# Patient Record
Sex: Male | Born: 1935 | Race: White | Hispanic: No | Marital: Married
Health system: Southern US, Community
[De-identification: ages and names within clinical notes are randomized; demographics above are authoritative.]

---

## 2004-06-08 ENCOUNTER — Ambulatory Visit: Payer: Self-pay

## 2006-09-13 ENCOUNTER — Emergency Department: Payer: Self-pay | Admitting: Emergency Medicine

## 2007-03-23 ENCOUNTER — Other Ambulatory Visit: Payer: Self-pay

## 2007-03-23 ENCOUNTER — Ambulatory Visit: Payer: Self-pay | Admitting: Unknown Physician Specialty

## 2007-03-28 ENCOUNTER — Ambulatory Visit: Payer: Self-pay | Admitting: Unknown Physician Specialty

## 2007-05-03 ENCOUNTER — Inpatient Hospital Stay: Payer: Self-pay | Admitting: Internal Medicine

## 2007-05-03 ENCOUNTER — Other Ambulatory Visit: Payer: Self-pay

## 2007-05-04 ENCOUNTER — Other Ambulatory Visit: Payer: Self-pay

## 2009-05-29 ENCOUNTER — Ambulatory Visit: Payer: Self-pay | Admitting: Gastroenterology

## 2011-03-19 ENCOUNTER — Other Ambulatory Visit: Payer: Self-pay | Admitting: Rheumatology

## 2011-04-12 ENCOUNTER — Ambulatory Visit: Payer: Self-pay

## 2011-06-28 ENCOUNTER — Inpatient Hospital Stay: Payer: Self-pay | Admitting: Internal Medicine

## 2011-06-28 LAB — CBC
HCT: 24 % — ABNORMAL LOW (ref 40.0–52.0)
HGB: 7.4 g/dL — ABNORMAL LOW (ref 13.0–18.0)
MCHC: 30.9 g/dL — ABNORMAL LOW (ref 32.0–36.0)
MCV: 85 fL (ref 80–100)
Platelet: 322 10*3/uL (ref 150–440)
RBC: 2.8 10*6/uL — ABNORMAL LOW (ref 4.40–5.90)
RDW: 18.1 % — ABNORMAL HIGH (ref 11.5–14.5)
WBC: 6.5 10*3/uL (ref 3.8–10.6)

## 2011-06-28 LAB — COMPREHENSIVE METABOLIC PANEL
Alkaline Phosphatase: 98 U/L (ref 50–136)
BUN: 21 mg/dL — ABNORMAL HIGH (ref 7–18)
Bilirubin,Total: 0.5 mg/dL (ref 0.2–1.0)
Chloride: 104 mmol/L (ref 98–107)
Co2: 33 mmol/L — ABNORMAL HIGH (ref 21–32)
EGFR (Non-African Amer.): >60
Potassium: 4.1 mmol/L (ref 3.5–5.1)
SGOT(AST): 20 U/L (ref 15–37)
SGPT (ALT): 25 U/L

## 2011-06-28 LAB — URINALYSIS, COMPLETE
Blood: NEGATIVE
Glucose,UR: NEGATIVE mg/dL (ref 0–75)
Ketone: NEGATIVE
Ph: 7 (ref 4.5–8.0)
Protein: NEGATIVE
Specific Gravity: 1.006 (ref 1.003–1.030)

## 2011-06-28 LAB — HEMOGLOBIN: HGB: 7.5 g/dL — ABNORMAL LOW (ref 13.0–18.0)

## 2011-06-28 LAB — PROTIME-INR: Prothrombin Time: 13.3 secs (ref 11.5–14.7)

## 2011-06-29 LAB — BASIC METABOLIC PANEL
Anion Gap: 9 (ref 7–16)
Calcium, Total: 8.2 mg/dL — ABNORMAL LOW (ref 8.5–10.1)
Chloride: 104 mmol/L (ref 98–107)
Co2: 32 mmol/L (ref 21–32)
Creatinine: 0.79 mg/dL (ref 0.60–1.30)
EGFR (African American): >60
Sodium: 145 mmol/L (ref 136–145)

## 2011-06-29 LAB — CBC WITH DIFFERENTIAL/PLATELET
Eosinophil #: 0.6 10*3/uL (ref 0.0–0.7)
Eosinophil %: 8.8 %
HCT: 27.6 % — ABNORMAL LOW (ref 40.0–52.0)
HGB: 8.7 g/dL — ABNORMAL LOW (ref 13.0–18.0)
Lymphocyte #: 0.7 10*3/uL — ABNORMAL LOW (ref 1.0–3.6)
MCH: 27.3 pg (ref 26.0–34.0)
MCV: 86 fL (ref 80–100)
Monocyte %: 7.4 %
Neutrophil %: 74.2 %
Platelet: 285 10*3/uL (ref 150–440)
RDW: 17.3 % — ABNORMAL HIGH (ref 11.5–14.5)

## 2011-06-29 LAB — HEMOGLOBIN: HGB: 8.8 g/dL — ABNORMAL LOW (ref 13.0–18.0)

## 2011-07-04 ENCOUNTER — Inpatient Hospital Stay: Payer: Self-pay | Admitting: Internal Medicine

## 2011-07-04 LAB — CBC
MCHC: 30.7 g/dL — ABNORMAL LOW (ref 32.0–36.0)
MCV: 87 fL (ref 80–100)
Platelet: 311 10*3/uL (ref 150–440)
RBC: 3.32 10*6/uL — ABNORMAL LOW (ref 4.40–5.90)
RDW: 18.7 % — ABNORMAL HIGH (ref 11.5–14.5)
WBC: 11.9 10*3/uL — ABNORMAL HIGH (ref 3.8–10.6)

## 2011-07-04 LAB — URINALYSIS, COMPLETE
Bacteria: NONE SEEN
Bilirubin,UR: NEGATIVE
Blood: NEGATIVE
Glucose,UR: NEGATIVE mg/dL (ref 0–75)
Leukocyte Esterase: NEGATIVE
Nitrite: NEGATIVE
Ph: 5 (ref 4.5–8.0)
Squamous Epithelial: NONE SEEN
WBC UR: NONE SEEN /HPF (ref 0–5)

## 2011-07-04 LAB — COMPREHENSIVE METABOLIC PANEL
Alkaline Phosphatase: 112 U/L (ref 50–136)
Anion Gap: 7 (ref 7–16)
Bilirubin,Total: 0.6 mg/dL (ref 0.2–1.0)
Calcium, Total: 8.4 mg/dL — ABNORMAL LOW (ref 8.5–10.1)
Chloride: 103 mmol/L (ref 98–107)
Co2: 34 mmol/L — ABNORMAL HIGH (ref 21–32)
Creatinine: 0.76 mg/dL (ref 0.60–1.30)
EGFR (Non-African Amer.): >60
Osmolality: 290 (ref 275–301)
Potassium: 4.2 mmol/L (ref 3.5–5.1)
Sodium: 144 mmol/L (ref 136–145)
Total Protein: 6.7 g/dL (ref 6.4–8.2)

## 2011-07-04 LAB — PROTIME-INR
INR: 1.1
Prothrombin Time: 14.3 secs (ref 11.5–14.7)

## 2011-07-04 LAB — CK TOTAL AND CKMB (NOT AT ARMC)
CK, Total: 30 U/L — ABNORMAL LOW (ref 35–232)
CK-MB: 1.1 ng/mL (ref 0.5–3.6)

## 2011-07-05 LAB — BASIC METABOLIC PANEL
Anion Gap: 8 (ref 7–16)
BUN: 20 mg/dL — ABNORMAL HIGH (ref 7–18)
Calcium, Total: 8.6 mg/dL (ref 8.5–10.1)
Chloride: 99 mmol/L (ref 98–107)
EGFR (African American): >60
Osmolality: 290 (ref 275–301)
Potassium: 4.2 mmol/L (ref 3.5–5.1)

## 2011-07-05 LAB — CBC WITH DIFFERENTIAL/PLATELET
Basophil #: 0 10*3/uL (ref 0.0–0.1)
Basophil %: 0 %
HCT: 27.7 % — ABNORMAL LOW (ref 40.0–52.0)
MCH: 26.8 pg (ref 26.0–34.0)
MCV: 87 fL (ref 80–100)
Monocyte #: 0.3 10*3/uL (ref 0.0–0.7)
Neutrophil #: 8.8 10*3/uL — ABNORMAL HIGH (ref 1.4–6.5)
Platelet: 306 10*3/uL (ref 150–440)
RBC: 3.19 10*6/uL — ABNORMAL LOW (ref 4.40–5.90)
WBC: 9.3 10*3/uL (ref 3.8–10.6)

## 2011-07-06 LAB — VANCOMYCIN, TROUGH: Vancomycin, Trough: 14 ug/mL (ref 10–20)

## 2011-07-08 LAB — CBC WITH DIFFERENTIAL/PLATELET
Bands: 7 %
HCT: 29.8 % — ABNORMAL LOW (ref 40.0–52.0)
HGB: 9.2 g/dL — ABNORMAL LOW (ref 13.0–18.0)
MCH: 26.4 pg (ref 26.0–34.0)
MCHC: 30.9 g/dL — ABNORMAL LOW (ref 32.0–36.0)
MCV: 85 fL (ref 80–100)
Monocytes: 4 %
RDW: 18.4 % — ABNORMAL HIGH (ref 11.5–14.5)

## 2011-07-08 LAB — BASIC METABOLIC PANEL
Calcium, Total: 8.3 mg/dL — ABNORMAL LOW (ref 8.5–10.1)
EGFR (African American): >60
EGFR (Non-African Amer.): >60
Glucose: 164 mg/dL — ABNORMAL HIGH (ref 65–99)
Osmolality: 287 (ref 275–301)
Sodium: 138 mmol/L (ref 136–145)

## 2011-07-10 LAB — EXPECTORATED SPUTUM ASSESSMENT W GRAM STAIN, RFLX TO RESP C

## 2011-07-10 LAB — CULTURE, BLOOD (SINGLE)

## 2011-10-22 ENCOUNTER — Inpatient Hospital Stay: Payer: Self-pay | Admitting: Student

## 2011-10-22 LAB — CBC
HCT: 30.1 % — ABNORMAL LOW (ref 40.0–52.0)
HGB: 9.4 g/dL — ABNORMAL LOW (ref 13.0–18.0)
MCH: 27.6 pg (ref 26.0–34.0)
MCV: 89 fL (ref 80–100)
Platelet: 286 10*3/uL (ref 150–440)
RBC: 3.4 10*6/uL — ABNORMAL LOW (ref 4.40–5.90)
RDW: 15.8 % — ABNORMAL HIGH (ref 11.5–14.5)
WBC: 8.3 10*3/uL (ref 3.8–10.6)

## 2011-10-22 LAB — URINALYSIS, COMPLETE
Bacteria: NONE SEEN
Bilirubin,UR: NEGATIVE
Ketone: NEGATIVE
Nitrite: NEGATIVE
Protein: NEGATIVE
Specific Gravity: 1.018 (ref 1.003–1.030)
Squamous Epithelial: <1
WBC UR: 3 /HPF (ref 0–5)

## 2011-10-22 LAB — PROTIME-INR
INR: 1
Prothrombin Time: 13.6 secs (ref 11.5–14.7)

## 2011-10-22 LAB — COMPREHENSIVE METABOLIC PANEL
Albumin: 2.6 g/dL — ABNORMAL LOW (ref 3.4–5.0)
Anion Gap: 6 — ABNORMAL LOW (ref 7–16)
BUN: 22 mg/dL — ABNORMAL HIGH (ref 7–18)
Bilirubin,Total: 0.5 mg/dL (ref 0.2–1.0)
Calcium, Total: 9 mg/dL (ref 8.5–10.1)
Chloride: 107 mmol/L (ref 98–107)
Co2: 30 mmol/L (ref 21–32)
Creatinine: 0.89 mg/dL (ref 0.60–1.30)
EGFR (African American): >60
EGFR (Non-African Amer.): >60
Glucose: 121 mg/dL — ABNORMAL HIGH (ref 65–99)
Osmolality: 290 (ref 275–301)
SGPT (ALT): 27 U/L
Sodium: 143 mmol/L (ref 136–145)
Total Protein: 6.4 g/dL (ref 6.4–8.2)

## 2011-10-22 LAB — TROPONIN I: Troponin-I: <0.02 ng/mL

## 2011-10-22 LAB — HEMOGLOBIN: HGB: 8.9 g/dL — ABNORMAL LOW (ref 13.0–18.0)

## 2011-10-22 LAB — APTT: Activated PTT: 30.9 secs (ref 23.6–35.9)

## 2011-10-23 LAB — CBC WITH DIFFERENTIAL/PLATELET
Basophil #: 0.1 10*3/uL (ref 0.0–0.1)
Basophil %: 0.7 %
Eosinophil #: 1.1 10*3/uL — ABNORMAL HIGH (ref 0.0–0.7)
Eosinophil %: 13 %
HGB: 8.4 g/dL — ABNORMAL LOW (ref 13.0–18.0)
Lymphocyte %: 13.4 %
MCH: 27.7 pg (ref 26.0–34.0)
MCHC: 31.1 g/dL — ABNORMAL LOW (ref 32.0–36.0)
MCV: 89 fL (ref 80–100)
Monocyte %: 9.8 %
Neutrophil #: 5.1 10*3/uL (ref 1.4–6.5)
Neutrophil %: 63.1 %
Platelet: 237 10*3/uL (ref 150–440)
RDW: 15.7 % — ABNORMAL HIGH (ref 11.5–14.5)
WBC: 8.1 10*3/uL (ref 3.8–10.6)

## 2011-10-23 LAB — IRON AND TIBC
Iron Bind.Cap.(Total): 265 ug/dL (ref 250–450)
Iron Saturation: 11 %
Iron: 30 ug/dL — ABNORMAL LOW (ref 65–175)
Unbound Iron-Bind.Cap.: 235 ug/dL

## 2011-10-23 LAB — HEMOGLOBIN: HGB: 8.2 g/dL — ABNORMAL LOW (ref 13.0–18.0)

## 2011-10-23 LAB — BASIC METABOLIC PANEL
Anion Gap: 2 — ABNORMAL LOW (ref 7–16)
Calcium, Total: 7.9 mg/dL — ABNORMAL LOW (ref 8.5–10.1)
Co2: 31 mmol/L (ref 21–32)
Creatinine: 0.8 mg/dL (ref 0.60–1.30)
EGFR (Non-African Amer.): >60
Glucose: 90 mg/dL (ref 65–99)
Osmolality: 283 (ref 275–301)
Sodium: 141 mmol/L (ref 136–145)

## 2011-10-23 LAB — FERRITIN: Ferritin (ARMC): 19 ng/mL (ref 8–388)

## 2011-10-24 LAB — CBC WITH DIFFERENTIAL/PLATELET
Basophil %: 1 %
Eosinophil #: 1.1 10*3/uL — ABNORMAL HIGH (ref 0.0–0.7)
Eosinophil %: 17.7 %
HCT: 27.9 % — ABNORMAL LOW (ref 40.0–52.0)
HGB: 8.6 g/dL — ABNORMAL LOW (ref 13.0–18.0)
Lymphocyte %: 14.5 %
MCH: 27.6 pg (ref 26.0–34.0)
RBC: 3.1 10*6/uL — ABNORMAL LOW (ref 4.40–5.90)
WBC: 6.4 10*3/uL (ref 3.8–10.6)

## 2011-10-25 LAB — CBC WITH DIFFERENTIAL/PLATELET
Basophil %: 1.1 %
Eosinophil #: 1 10*3/uL — ABNORMAL HIGH (ref 0.0–0.7)
HCT: 27.2 % — ABNORMAL LOW (ref 40.0–52.0)
HGB: 8.4 g/dL — ABNORMAL LOW (ref 13.0–18.0)
Lymphocyte #: 0.9 10*3/uL — ABNORMAL LOW (ref 1.0–3.6)
Lymphocyte %: 15.7 %
MCH: 27.8 pg (ref 26.0–34.0)
Neutrophil #: 2.9 10*3/uL (ref 1.4–6.5)
Platelet: 251 10*3/uL (ref 150–440)
RBC: 3.04 10*6/uL — ABNORMAL LOW (ref 4.40–5.90)
RDW: 15.6 % — ABNORMAL HIGH (ref 11.5–14.5)

## 2011-10-26 LAB — CBC WITH DIFFERENTIAL/PLATELET
Eosinophil #: 1 10*3/uL — ABNORMAL HIGH (ref 0.0–0.7)
HCT: 25.2 % — ABNORMAL LOW (ref 40.0–52.0)
HGB: 8 g/dL — ABNORMAL LOW (ref 13.0–18.0)
Lymphocyte #: 1 10*3/uL (ref 1.0–3.6)
Lymphocyte %: 16.8 %
MCH: 28.2 pg (ref 26.0–34.0)
MCHC: 31.9 g/dL — ABNORMAL LOW (ref 32.0–36.0)
MCV: 89 fL (ref 80–100)
Monocyte #: 0.7 x10 3/mm (ref 0.2–1.0)
Neutrophil #: 3.3 10*3/uL (ref 1.4–6.5)
Platelet: 244 10*3/uL (ref 150–440)
RBC: 2.84 10*6/uL — ABNORMAL LOW (ref 4.40–5.90)
RDW: 15.3 % — ABNORMAL HIGH (ref 11.5–14.5)

## 2012-01-07 ENCOUNTER — Inpatient Hospital Stay: Payer: Self-pay | Admitting: Internal Medicine

## 2012-01-07 LAB — COMPREHENSIVE METABOLIC PANEL
Alkaline Phosphatase: 96 U/L (ref 50–136)
Bilirubin,Total: 0.3 mg/dL (ref 0.2–1.0)
Co2: 29 mmol/L (ref 21–32)
Creatinine: 0.89 mg/dL (ref 0.60–1.30)
EGFR (African American): >60
EGFR (Non-African Amer.): >60
Glucose: 159 mg/dL — ABNORMAL HIGH (ref 65–99)
SGOT(AST): 16 U/L (ref 15–37)
SGPT (ALT): 14 U/L (ref 12–78)
Total Protein: 6.9 g/dL (ref 6.4–8.2)

## 2012-01-07 LAB — URINALYSIS, COMPLETE
Glucose,UR: NEGATIVE mg/dL (ref 0–75)
Hyaline Cast: 12
Ketone: NEGATIVE
Nitrite: NEGATIVE
Ph: 5 (ref 4.5–8.0)
Protein: NEGATIVE
Specific Gravity: 1.023 (ref 1.003–1.030)
Squamous Epithelial: <1

## 2012-01-07 LAB — CBC
HCT: 29.1 % — ABNORMAL LOW (ref 40.0–52.0)
HGB: 9.2 g/dL — ABNORMAL LOW (ref 13.0–18.0)
MCH: 27.8 pg (ref 26.0–34.0)
MCHC: 31.7 g/dL — ABNORMAL LOW (ref 32.0–36.0)
MCV: 88 fL (ref 80–100)
RDW: 16.3 % — ABNORMAL HIGH (ref 11.5–14.5)

## 2012-01-07 LAB — HEMOGLOBIN: HGB: 8.2 g/dL — ABNORMAL LOW (ref 13.0–18.0)

## 2012-01-07 LAB — PROTIME-INR
INR: 1
Prothrombin Time: 14 secs (ref 11.5–14.7)

## 2012-01-07 LAB — APTT: Activated PTT: 25.9 secs (ref 23.6–35.9)

## 2012-01-08 LAB — CBC WITH DIFFERENTIAL/PLATELET
Basophil #: 0.1 10*3/uL (ref 0.0–0.1)
Eosinophil #: 0.8 10*3/uL — ABNORMAL HIGH (ref 0.0–0.7)
Eosinophil %: 11.2 %
HGB: 7.3 g/dL — ABNORMAL LOW (ref 13.0–18.0)
Lymphocyte #: 1.2 10*3/uL (ref 1.0–3.6)
MCH: 29 pg (ref 26.0–34.0)
MCHC: 33.5 g/dL (ref 32.0–36.0)
MCV: 87 fL (ref 80–100)
Monocyte #: 0.8 x10 3/mm (ref 0.2–1.0)
Monocyte %: 10.9 %
Neutrophil #: 4.2 10*3/uL (ref 1.4–6.5)
Neutrophil %: 59.7 %
Platelet: 289 10*3/uL (ref 150–440)
RDW: 15.9 % — ABNORMAL HIGH (ref 11.5–14.5)

## 2012-01-08 LAB — HEMOGLOBIN: HGB: 7.4 g/dL — ABNORMAL LOW (ref 13.0–18.0)

## 2012-01-09 LAB — CBC WITH DIFFERENTIAL/PLATELET
Basophil %: 2.5 %
Eosinophil #: 1.7 10*3/uL — ABNORMAL HIGH (ref 0.0–0.7)
Eosinophil %: 17.2 %
HCT: 26.9 % — ABNORMAL LOW (ref 40.0–52.0)
Lymphocyte %: 14.7 %
MCHC: 32.7 g/dL (ref 32.0–36.0)
MCV: 87 fL (ref 80–100)
Monocyte %: 8.9 %
Neutrophil #: 5.5 10*3/uL (ref 1.4–6.5)
Neutrophil %: 56.7 %
WBC: 9.7 10*3/uL (ref 3.8–10.6)

## 2012-01-10 LAB — CBC WITH DIFFERENTIAL/PLATELET
Basophil #: 0.1 10*3/uL (ref 0.0–0.1)
Basophil %: 1 %
Eosinophil #: 1.4 10*3/uL — ABNORMAL HIGH (ref 0.0–0.7)
Eosinophil %: 18.5 %
Lymphocyte %: 14.5 %
MCH: 28.9 pg (ref 26.0–34.0)
MCV: 88 fL (ref 80–100)
Monocyte %: 9.9 %
Neutrophil %: 56.1 %
Platelet: 300 10*3/uL (ref 150–440)
RBC: 2.7 10*6/uL — ABNORMAL LOW (ref 4.40–5.90)
RDW: 15.6 % — ABNORMAL HIGH (ref 11.5–14.5)
WBC: 7.8 10*3/uL (ref 3.8–10.6)

## 2012-05-08 ENCOUNTER — Ambulatory Visit: Payer: Self-pay | Admitting: Internal Medicine

## 2013-09-24 IMAGING — NM NUCLEAR MEDICINE GASTROINTESTINAL BLEEDING STUDY
1 series · 6 of 6 positions shown · non-contrast
Comparison: none

REASON FOR EXAM: Lower GI bleed
COMMENTS:

[Series 1000: gi bleed · 4.80mm/px · 6 of 250 frames shown]
[frame 21/250]
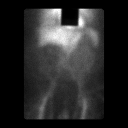
[frame 63/250]
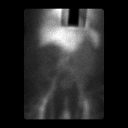
[frame 105/250]
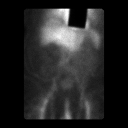
[frame 146/250]
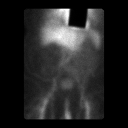
[frame 188/250]
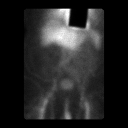
[frame 230/250]
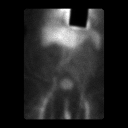

[6 of 6 positions shown; findings below may reference images not displayed]

PROCEDURE:     NM  - NM GI BLOOD LOSS STUDY  - June 28, 2011  [DATE]

RESULT:     The patient received an injection of 3.0 mL of PYP and 24.04 mCi
of technetium 99 M pertechnetate. Anterior acquisition is obtained over 60
minutes and reviewed dynamically. Normal radiotracer localization is seen
within the liver, spleen and vascular system. No abnormal accumulation of
tracer is noted to suggest active gastrointestinal hemorrhage. Increasing
localization is seen within the urinary bladder in a normal fashion.
IMPRESSION: 1. No findings to suggest active gastrointestinal hemorrhage.

## 2013-09-24 IMAGING — US US EXTREM LOW VENOUS BILAT
1 series · 17 of 24 positions shown · non-contrast
Comparison: none

REASON FOR EXAM: swelling/edema, ?DVT
COMMENTS:

[Series 1: us extrem low venous bilat · 17 of 59 slices shown]
[im 1/59]
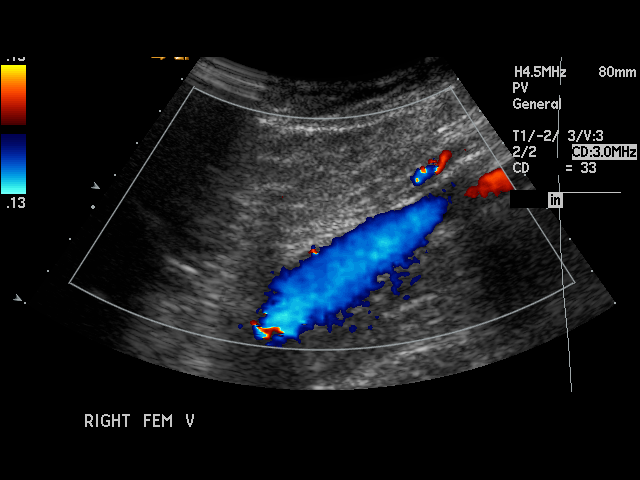
[im 6/59]
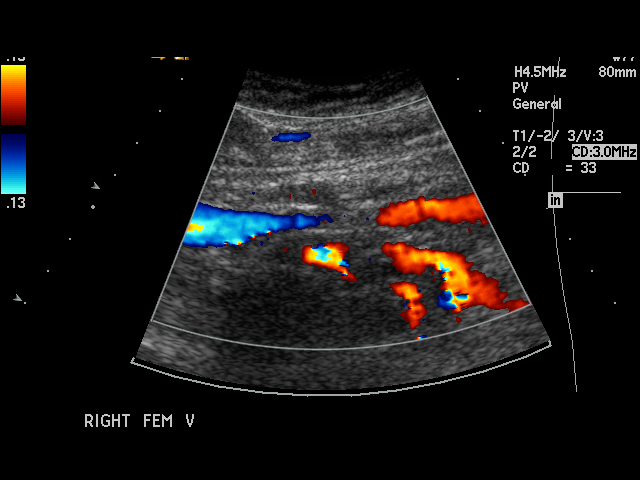
[im 8/59]
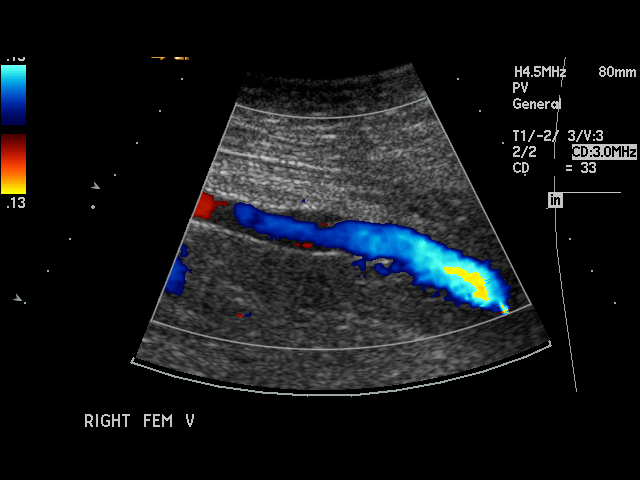
[im 11/59]
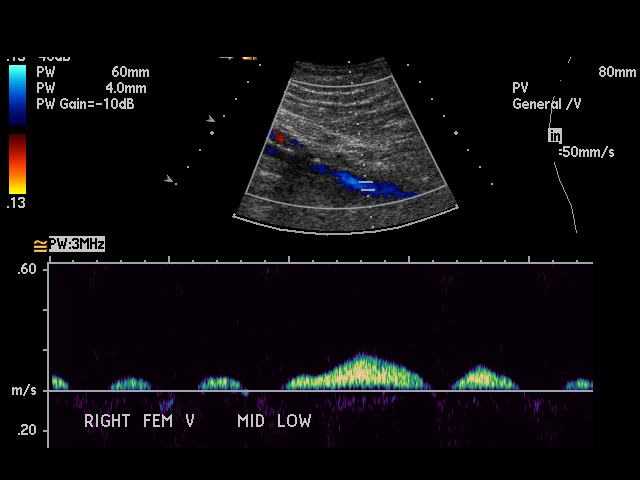
[im 16/59]
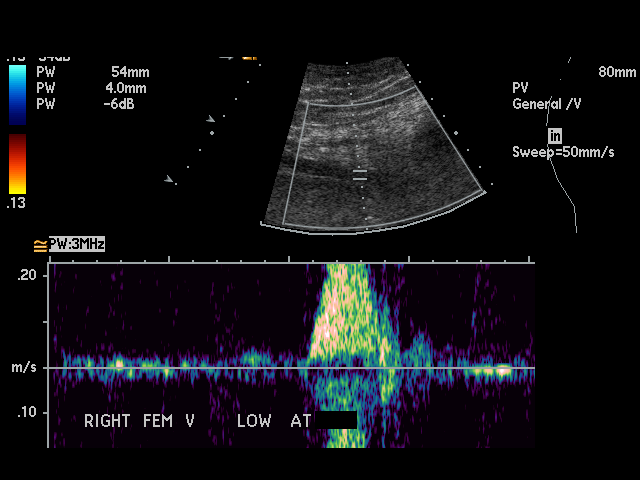
[im 18/59]
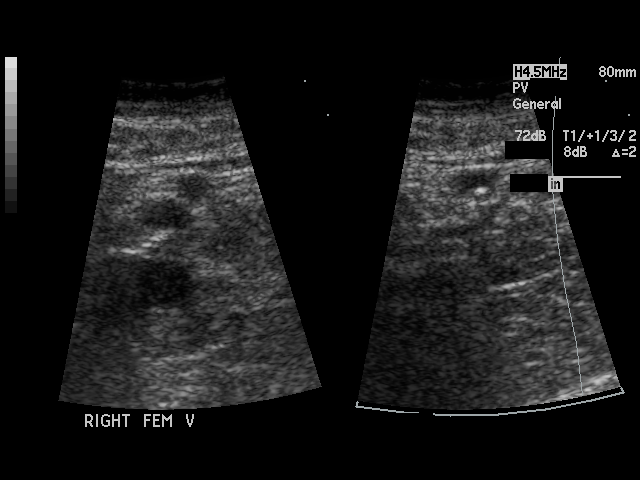
[im 23/59]
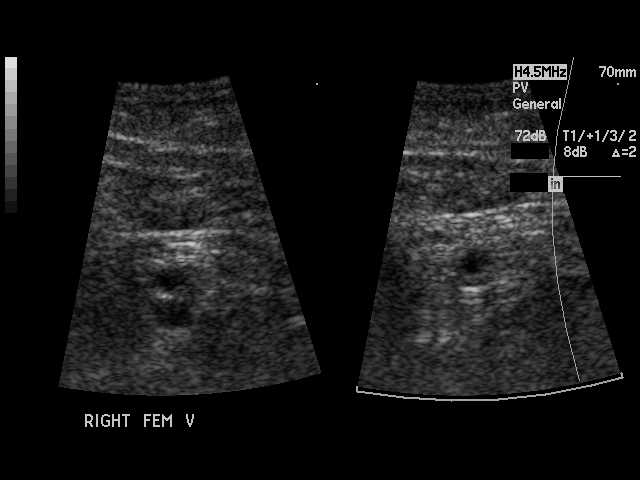
[im 26/59]
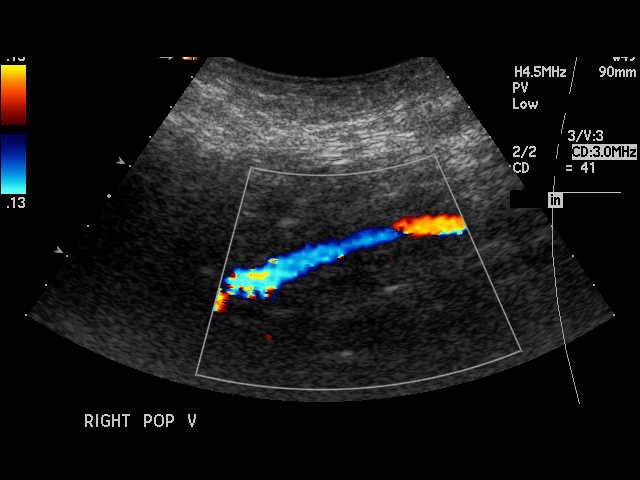
[im 31/59]
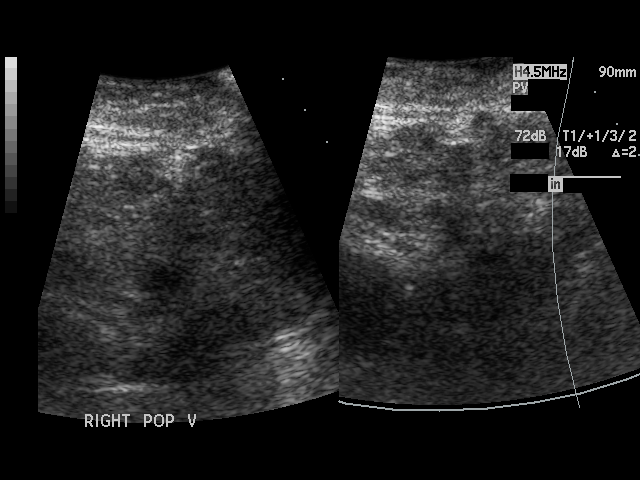
[im 33/59]
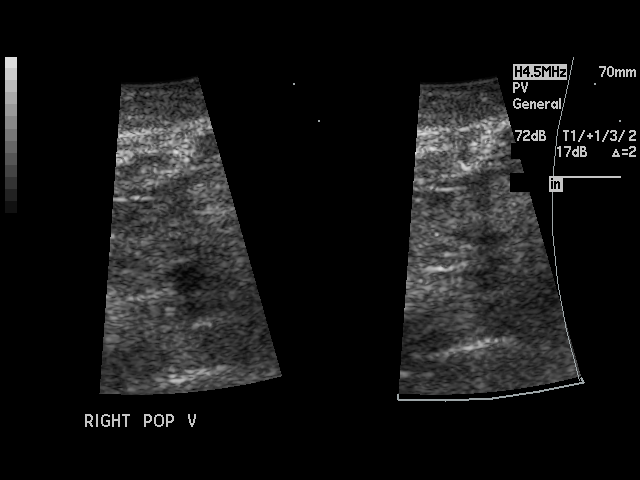
[im 36/59]
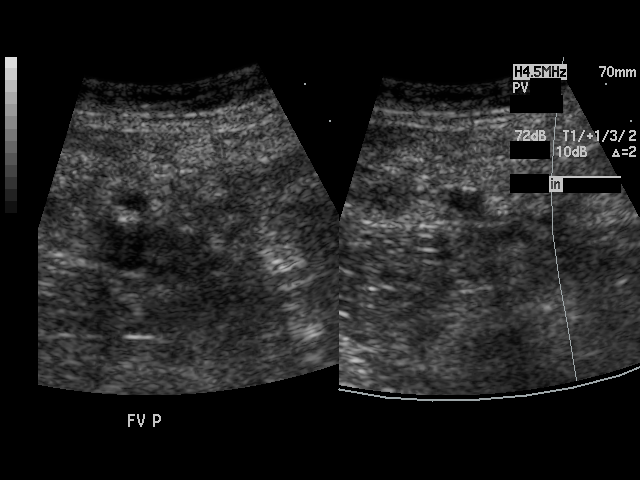
[im 41/59]
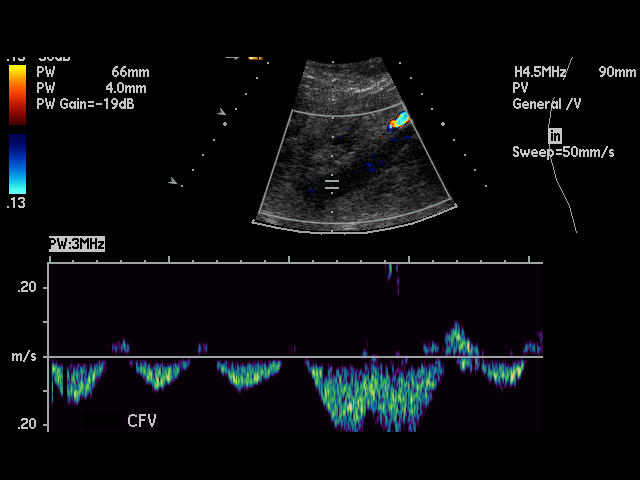
[im 43/59]
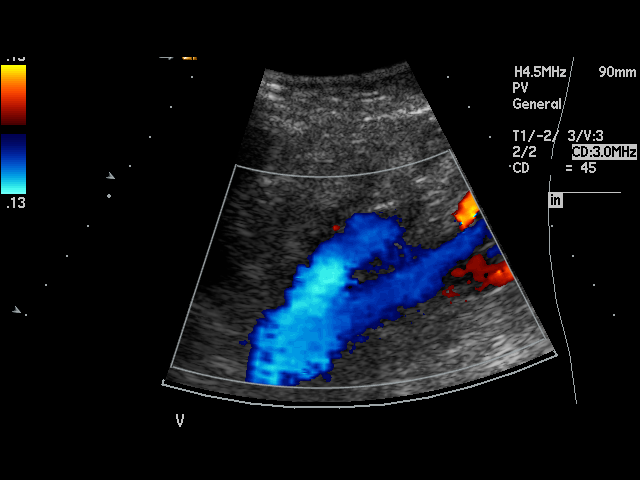
[im 48/59]
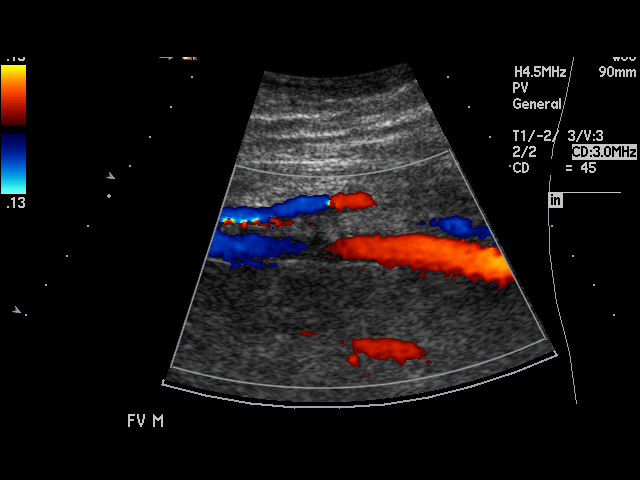
[im 51/59]
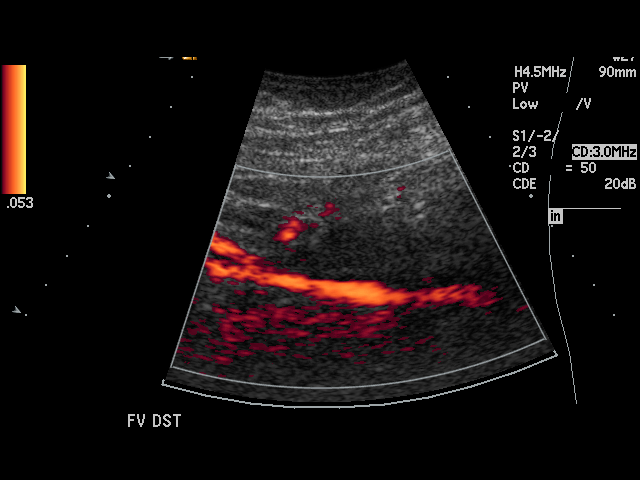
[im 53/59]
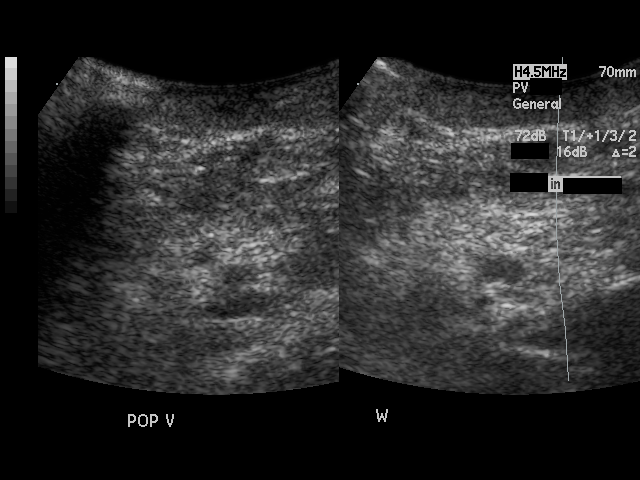
[im 59/59]
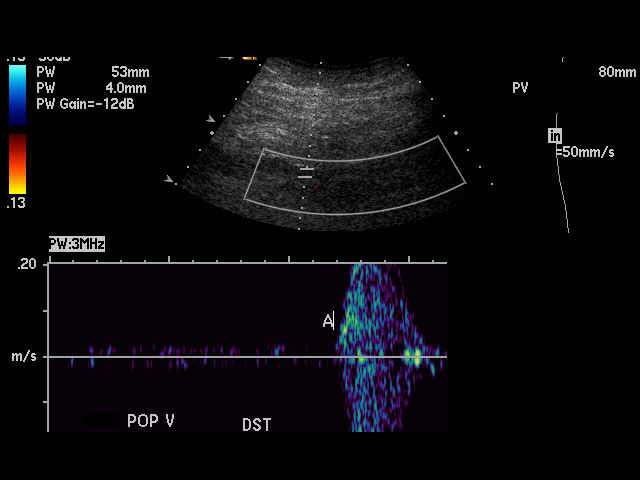

[17 of 24 positions shown; findings below may reference images not displayed]

PROCEDURE:     US  - US DOPPLER LOW EXTR BILATERAL  - June 28, 2011  [DATE]

RESULT:     Duplex Doppler interrogation of the deep venous system of both
legs from the inguinal to the popliteal region demonstrates the deep venous
systems are fully compressible throughout. The color Doppler and spectral
Doppler appearance is normal. There is normal response to distal
augmentation. The color Doppler images show no filling defect.
IMPRESSION: 1. No evidence of DVT in either lower extremity.

## 2014-08-20 NOTE — H&P (Signed)
PATIENT NAME:  Nicholas Raymond, [name] MR#:  [id number] DATE OF BIRTH:  [birth year]  DATE OF ADMISSION:  01/07/2012  PRIMARY CARE PHYSICIAN: Vonita MossMark Crissman, MD   REFERRING PHYSICIAN: Gaetano NetKevin Boland, MD    CHIEF COMPLAINT: Generalized weakness and rectal bleeding.   HISTORY OF PRESENT ILLNESS: Mr. Nicholas ReidCombs is a [age] year old pleasant Caucasian male with history of rheumatoid arthritis, obstructive sleep apnea, paroxysmal atrial fibrillation, anemia that is combined anemia of chronic disease and anemia secondary to GI bleed. The patient had recent rectal bleeding that started yesterday, had a large bowel movement consisting of clots of red blood. Then he had another episode the following day after which he developed generalized weakness. He stayed in bed and he is unable to get out of bed or walk. The patient was brought to the Emergency Department for evaluation. His work-up showed a hemoglobin of 9, which is the same hemoglobin he had in June of this year when he was admitted with rectal bleeding. At that time he had sigmoidoscopy which showed diverticulosis of the sigmoid colon. The patient for the time being is scheduled to have colonoscopy in two weeks apparently with Dr. Niel HummerIftikhar.    REVIEW OF SYSTEMS: CONSTITUTIONAL: Denies any fever. No chills but he has generalized fatigue. EYES: No blurring of vision. No double vision. ENT: No hearing impairment. No sore throat. No dysphagia. CARDIOVASCULAR: No chest pain. No shortness of breath. No edema. RESPIRATORY: No shortness of breath. No cough. No hemoptysis. GASTROINTESTINAL: Reports few crampy abdominal pain yesterday but none now. Had bright red blood per rectum x2 episodes. Both were large amount. No vomiting. GENITOURINARY: No dysuria. No frequency of urination. MUSCULOSKELETAL: No joint pain other than his chronic rheumatoid arthritis pains. No joint swelling. No muscular pain or swelling. INTEGUMENTARY: No skin rash. No ulcers. NEUROLOGY: No focal weakness. No  seizure activity. No headache. PSYCHIATRY: No anxiety. No depression. ENDOCRINE: No polyuria or polydipsia. No heat or cold intolerance.   PAST MEDICAL HISTORY:  1. Admitted last time here on June 21st of this year, discharged on June 25th with rectal bleeding. At that time he had dizziness and hypotension. He underwent sigmoidoscopy by Dr. Marva PandaSkulskie and that showed sigmoid diverticulosis but no active bleeding. His initial hemoglobin at that time was 9.4 and went down to 8.  2. Rheumatoid arthritis.  3. Obstructive sleep apnea syndrome. 4. Chronic obstructive pulmonary disease. 5. Benign prostatic hypertrophy. 6. Paroxysmal atrial fibrillation.  7. 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  8. Chronic diastolic congestive heart failure with normal ejection fraction. 9. Chronic lower extremity edema.   PAST SURGICAL HISTORY:  1. Bilateral knee joint replacement.  2. Right carpal tunnel surgery. 3. Surgery for bowel obstruction at age of 81.  66. He had colonoscopy which showed diverticulosis in January of 2011. Then he had sigmoidoscopy in June of 2013 which showed sigmoid diverticulosis.   FAMILY HISTORY: Both parents died of old age. His father died at age of 79 and his mother died at age of 79. She suffered from a heart attack and she had Alzheimer's disease.   SOCIAL HISTORY: The patient used to work in [employer] business but he got his disability at age of 81 based on his rheumatoid arthritis. He is married, living with his wife.   SOCIAL HABITS: Ex chronic smoker. He quit about 10 years ago. Prior to that he used to smoke 1-1/2 packs a day for at least 25 years. No history of alcoholism or other drug abuse.  ADMISSION MEDICATIONS:  1. Aspirin 81 mg a day. 2. Prednisone 5 mg a day. 3. Leflunomide 20 mg once a day. 4. Vitamin B12 one tablet a day. Dose was not specified. 5. Ferrous sulfate 325 mg once a day.   ALLERGIES: Lodine causes skin rash.       PHYSICAL EXAMINATION:   VITAL SIGNS: Blood pressure 114/62, respiratory rate 18, pulse 82, temperature 98.7, oxygen saturation 94%.   GENERAL APPEARANCE: Elderly male laying in bed in no acute distress.   HEAD: Mild pallor. No icterus. No cyanosis.   EARS, NOSE, AND THROAT: Hearing was normal. Nasal mucosa, lips, tongue were normal.   EYES: Normal eyelids and conjunctivae. Pupils about 5 mm, equal and reactive to light.   NECK: Supple. Trachea at midline. No thyromegaly. No cervical lymphadenopathy. No masses.   HEART: Normal S1, S2. There is grade 3/6 systolic murmur at the apex and left sternal border. No carotid bruits.   RESPIRATORY: Normal breathing pattern without use of accessory muscles. No rales. No wheezing.   ABDOMEN: Soft without tenderness. It is slightly distended. No hepatosplenomegaly. No masses. No hernias.   SKIN: No ulcers. No subcutaneous nodules.   MUSCULOSKELETAL: No joint swelling. No clubbing.   NEUROLOGIC: Cranial nerves II through XII were intact. No focal motor deficit.   PSYCHIATRY: The patient is alert and oriented x3. Mood and affect were normal. He appears slightly anxious.   LABORATORY, DIAGNOSTIC, AND RADIOLOGICAL DATA: EKG showed normal sinus rhythm at rate of 76 per minute, otherwise unremarkable EKG.  Serum glucose 159, BUN 22, creatinine 0.8, sodium 143, potassium 4. Normal liver function tests except for slightly low albumin at 2.5. CBC showed white count of 8500, hemoglobin 9.2, hematocrit 29, platelet count 279. Normal MCV, MCH. Slightly low MCHC at 31. Prothrombin time 14. INR 1. APTT 25.   ASSESSMENT:  1. Rectal bleeding. 2. Generalized fatigue.  3. Anemia with hemoglobin of 9. This is at his baseline compared to his last admission. His anemia is combined anemia from chronic disease and anemia secondary to iron deficiency anemia.  4. Chronic obstructive pulmonary disease. 5. Obstructive sleep apnea syndrome, on 2 liters of  oxygen at night.  6. Rheumatoid arthritis.  7. Paroxysmal atrial fibrillation. 8. Diverticulosis. 9. Chronic diastolic congestive heart failure with normal ejection fraction.   PLAN:  1. Will admit the patient to the telemetry unit with monitoring and follow-up on his hemoglobin. Right now his anemia is not at a level that needs transfusion, however, we will see the trend.  2. Obtain GI consultation.  3. Continue his home medications as listed above but I will hold his aspirin.  4. Again, I would like to mention that apparently there are plans to have colonoscopy in the next couple of weeks with Dr. Lurline Del.   The patient does not have a LIVING WILL and he has not appointed anybody to have the power-of-attorney.   TIME SPENT: Time Spent evaluating this patient and reviewing his medical records took more than 55 minutes.   ____________________________ Carney Corners. Rudene Re, MD amd:drc D: 01/07/2012 04:25:52 ET T: 01/07/2012 08:01:01 ET JOB#: [id number]  cc: Carney Corners. Rudene Re, MD, <Dictator>, Steele Sizer, MD Karolee Ohs Dala Dock MD ELECTRONICALLY SIGNED 01/07/2012 22:33

## 2014-08-20 NOTE — Consult Note (Signed)
CC: LGI bleeding.  No more bleeding reported. Hgb 8.8 today, plt 347, hungry for more food.  VSS afebrile. Will advance diet to full liquids and go from there.  If no further bleeding he may go home tomorrow from GI stand point.  Electronic Signatures: Scot JunElliott, Tykiera Raven T (MD)  (Signed on 08-Sep-13 09:36)  Authored  Last Updated: 08-Sep-13 09:36 by Scot JunElliott, Rocky Rishel T (MD)

## 2014-08-20 NOTE — Discharge Summary (Signed)
PATIENT NAME:  Nicholas SallesCOMBS, Nicholas H MR#:  [id number] DATE OF BIRTH:  October 28, 1932  DATE OF ADMISSION:  01/07/2012 DATE OF DISCHARGE:  01/10/2012  DISCHARGE DIAGNOSIS: Rectal bleed likely lower gastrointestinal in nature, possible diverticular and now resolved status post two packed red blood cell transfusions and stable hemoglobin and hematocrit and hemodynamics.   SECONDARY DIAGNOSES:  1. Rheumatoid arthritis.  2. Obstructive sleep apnea.  3. Chronic obstructive pulmonary disease.  4. Benign prostatic hypertrophy.  5. Paroxysmal atrial fibrillation.  6. Chronic diastolic heart failure with normal ejection fraction.  7. Chronic lower extremity edema.   CONSULTANTS: Lynnae Prudeobert Elliott, MD - Gastroenterology.  PROCEDURES/RADIOLOGY: None.   MAJOR LABORATORY PANEL: Urinalysis on admission was negative.   HISTORY AND SHORT HOSPITAL COURSE: The patient is a 81 year old male with the above-mentioned medical problems who was admitted for rectal bleed. Please see Nicholas Raymond's dictated history and physical for further details. He required two units of packed red blood cell transfusion for conservative management. Gastroenterology consultation was obtained with Nicholas Raymond who agreed with the same as his bleeding was thought to be diverticular in nature. His hemoglobin had dropped as low as 7.3 and on the date of discharge it was 7.8. He remained hemodynamically stable, did not have any further bleeding, and tolerated his diet fine as this was gradually advanced. He was stable enough to be discharged home on 01/10/2012.      PERTINENT DISCHARGE PHYSICAL EXAMINATION:  VITALS:  On the date of discharge, his temperature was 98, heart rate 89 per minute, respirations 20 per minute, blood pressure 113/67 mmHg, and he was saturating 92% on room air.   CARDIOVASCULAR: S1 and S2 normal. No murmurs, rubs or gallops.   LUNGS: Clear to auscultation bilaterally. No wheezes, rales, rhonchi, or crepitation.    ABDOMEN: Soft, benign.   NEUROLOGIC: Nonfocal examination. All other physical examination remained at baseline.   DISCHARGE MEDICATIONS:  1. Leflunomide 20 mg p.o. daily.  2. Prednisone 5 mg 1/2 tablet p.o. daily.  3. Vitamin B12 one tablet p.o. daily.  4. Feosol 325 mg p.o. daily.   DISCHARGE DIET: Regular, mechanical soft with pureed meats, pudding thick liquids, and avoid any food containing nuts.   DISCHARGE ACTIVITY: As tolerated.   DISCHARGE INSTRUCTIONS AND FOLLOW-UP: The patient was instructed to stop aspirin until this coming Wednesday, on 01/12/2012, and if he does not have any further bleeding he can resume a baby aspirin. He will need follow-up with his primary care physician, Nicholas Raymond, in 1 to 2 weeks, and with gastroenterology, Nicholas Raymond, in 2 to 4 weeks as scheduled.            He was instructed to keep soft diet for the next few days and start regular diet by the end of the week if no further bleeding.  TOTAL TIME DISCHARGING THIS PATIENT: 45 minutes. ____________________________ Ellamae SiaVipul S. Sherryll BurgerShah, MD vss:slb D: 01/11/2012 17:26:06 ET T: 01/12/2012 11:28:59 ET JOB#: [id number]  cc: Lovetta Condie S. Sherryll BurgerShah, MD, <Dictator> Steele SizerMark A. Crissman, MD Lurline DelShaukat Iftikhar, MD Scot Junobert T. Elliott, MD Ellamae SiaVIPUL S [facility] MD ELECTRONICALLY SIGNED 01/12/2012 16:51

## 2014-08-20 NOTE — Consult Note (Signed)
CC LGI bleeding.  I examined his abdomen and he does not have signif tenderness.  He appears to have acute bleeding from diverticular disease.  Will transfuse one unit tonight and check labs in morning.  Possible colonoscopy tomorrow.  Most diverticular bleeding quits on its own.  Will get consent for transfusion.  Electronic Signatures: Scot JunElliott, Robert T (MD)  (Signed on 06-Sep-13 19:47)  Authored  Last Updated: 06-Sep-13 19:47 by Scot JunElliott, Robert T (MD)

## 2014-08-20 NOTE — Consult Note (Signed)
Brief Consult Note: Diagnosis: Bright red rectal bleeding recurrent, prior colonoscopy 2011 showed diverticulosis, acute on chronic  anemia, COPD.   Patient was seen by consultant.   Comments: EGD/colonoscopy per Dr. Mechele CollinElliott recommendation. Wife had heart attack, patient is requesting studies be done as inpatient as it is very hard for him to get around. Lung fields with decreased breath sounds and some wheezing. DRE by me, fresh blood. Abdomen is non-tender.  Electronic Signatures: Rowan BlaseMills, Katyana Trolinger Ann (NP)  (Signed 06-Sep-13 18:18)  Authored: Brief Consult Note   Last Updated: 06-Sep-13 18:18 by Rowan BlaseMills, Kalum Minner Ann (NP)

## 2014-08-20 NOTE — Consult Note (Signed)
CC: LGI bleed.  No further bleeding noted and he feels like he could go home.  Recommend very soft diet for a few days. Follow up as needed in my office.  Electronic Signatures: Scot JunElliott, Robert T (MD)  (Signed on 09-Sep-13 10:13)  Authored  Last Updated: 09-Sep-13 10:13 by Scot JunElliott, Robert T (MD)

## 2014-08-20 NOTE — Consult Note (Signed)
PATIENT NAME:  Nicholas Raymond, Nicholas Raymond MR#:  [id number] DATE OF BIRTH:  13-Jul-1932  DATE OF CONSULTATION:  01/07/2012  REFERRING PHYSICIAN:  Dr. Rudene Re  CONSULTING PHYSICIAN:  Lynnae Prude, MD/Gwyndolyn Guilford A. Arvilla Market, ANP  REASON FOR CONSULTATION: Rectal bleed.   HISTORY OF PRESENT ILLNESS: This 81 year old male has a history of rheumatoid arthritis, COPD, BPH, paroxysmal atrial fibrillation, chronic anemia, and diastolic congestive heart failure who was admitted to the hospital today for generalized weakness, and rectal bleeding.   The patient reports that he was set up to have an upper endoscopy/colonoscopy done this month by Dr. Niel Hummer for problems with recurrent rectal bleeding. He says he had actually been doing well and started bleeding like crazy with a bowel movement last night. He said he had seen no actual fresh blood until last night. He passed a stool, was mostly blood. He said it was a large amount. Thirty minutes later he had to get out of bed again, have another bowel movement and said this was double the bleeding. He called 9-1-1 because he was lightheaded and said he could not get up off the toilet. He had no significant pain or cramps. He has passed several bowel movements today. Says he feels fair. He saw less blood this morning but still present. He's been on clear liquid diet. No abdominal pain, nausea, or vomiting.   The patient was recently admitted in June with rectal bleeding with dizziness and hypotension. Dr. Marva Panda performed flexible sigmoidoscopy that showed sigmoid diverticulosis but no active bleeding. Hemoglobin was 9.4, decreased to 8. Prior to that Dr. Mechele Collin saw the patient in February 2013 for multiple episodes of painless rectal bleeding, came in with a hemoglobin of 7.4, orthostatic hypotension. He had had a colonoscopy in 2011 that showed multiple small and large diverticula in the sigmoid and descending colon. GI bleeding scan showed no active bleeding. He was transfused,  monitored, and did not require any further intervention in February. Per record review, the colonoscopy in 2011 showed a poor prep, therefore, there were plans to repeat colonoscopy.   PAST MEDICAL HISTORY:  1. Chronic obstructive pulmonary disease.  2. Obstructive sleep apnea, uses oxygen at night.  3. History of intestinal blockage 08/16, underwent surgery.  4. Paroxysmal atrial fibrillation.  5. Renal insufficiency.  6. History of sigmoid and descending colon diverticulosis.  7. Chronic anemia and iron deficiency anemia.  8. Chronic diastolic congestive heart failure with lower extremity edema.   PAST SURGICAL HISTORY:  1. Right carpal tunnel release in 2008.  2. Bilateral knee replacements.  3. Surgery on bowel obstruction age 33.  4. Colonoscopy in 2011. 5. Flexible sigmoidoscopy June 2013 for rectal bleeding that showed diverticulosis.   MEDICATIONS ON ARRIVAL:  1. Aspirin 81 mg daily. 2. Prednisone 5 mg daily. 3. Leflunomide 20 mg daily. 4. Vitamin B12 1 daily. 5. Ferrous sulfate 325 mg daily.   Denies NSAID use.   ALLERGIES: Lodine causes skin rash.   HABITS: Previous tobacco, 1-1/2 packs a day for 25 years, quit 10 years ago. Negative alcohol.   FAMILY HISTORY: Father deceased at age 74. Mother deceased at age 92. Negative for colon cancer, colon polyps.   REVIEW OF SYSTEMS: The patient says his health overall is poor. He has rheumatoid arthritis. Says he's had some problems with his right hand. He has been feeling weak at home, normally is short of breath, says he does not do much. He stopped prednisone recently. He has had some problems with rash. Appetite has  been pretty good. Has had no nausea or vomiting or dysphagia. Reports weight is fairly stable. Remaining 10 systems are negative.   PHYSICAL EXAMINATION:   VITAL SIGNS: Temperature 97.6, pulse 74, respirations 20, blood pressure 105/63 to 89/58, pulse oximetry 98 to 95% on 2 liters nasal cannula.   GENERAL:  Pleasant Caucasian elderly male in no acute distress.   HEENT: Head is normocephalic. Conjunctivae pale pink. Sclerae anicteric. Oral mucosa is dry and intact. Poor dentition. Missing teeth. Cavities noted.   NECK: Supple. Trachea midline.   HEART: Heart tones S1 and S2 seems regular. No murmur, rub, or gallop.   LUNGS: Lungs have decreased breath sounds throughout. Some scattered wheezing noted. Using oxygen therapy as he gets a little short of breath laying down without it.   ABDOMEN: Soft. Bowel sounds are present. Nontender in all quadrants.   RECTAL: Red blood on the finger. No stool. Without palpable mass.   EXTREMITIES: Lower extremities with positive edema.   SKIN: Warm and dry, scaling skin. No discrete rash.   NEUROLOGIC: Cranial nerves II through XII grossly intact. Gait not evaluated. Sitting up in bed. Moving in bed with minimal assistance.   MUSCULOSKELETAL: He has a little bit of thickness noted in his wrist, hands. No diffuse erythema or warmth. Grasp is equal bilaterally.   LABORATORY, DIAGNOSTIC, AND RADIOLOGICAL DATA: Glucose 159, BUN 22, creatinine 0.89. Liver panel with albumin 2.5, otherwise normal. WBC 8.5. Admission hemoglobin 9.2 down to 8.2. PT 14.0. INR 1.0. PTT 25.9. Urinalysis is hazy, 2+ blood.   IMPRESSION: The patient has had several readmissions for rectal bleeding, painless bleeding, and was set up for outpatient scopes earlier this month when he developed acute rectal bleeding again last night. Still has blood in the vault. Hemoglobin is baseline about 9, he came in at 9 and is drifting some. Complex medical patient with significant pulmonary history, decreased breath sounds, some wheezing, using oxygen. Blood pressure is on the low side. He's had a history of blood transfusions in previous admissions.      PLAN:  1. Discuss timing of luminal evaluation with Dr. Mechele CollinElliott.  2. Further GI recommendations pending.   These services provided by Cala BradfordKimberly  A. Arvilla MarketMills, ANP under collaborative agreement with Lynnae Prudeobert Elliott, MD.   ____________________________ Ranae PlumberKimberly A. Arvilla MarketMills, ANP kam:drc D: 01/07/2012 16:10:59 ET T: 01/08/2012 09:54:33 ET JOB#: [id number]  cc: Cala BradfordKimberly A. Arvilla MarketMills, ANP, <Dictator> Ranae PlumberKimberly A. Suzette BattiestMills RN, MSN, ANP-BC Adult Nurse Practitioner ELECTRONICALLY SIGNED 01/11/2012 15:46

## 2014-08-20 NOTE — Consult Note (Signed)
CC: lower GI bleed, he got a unit of blood last night and still hgb is falling to 7.3 this morning with his bleeding during the night.  Last stool was at 3am.  He looks paler than last night and nurses reported him weaker.  chest clear heart 1/6 sem.  I spoke to the hospitalist and recommended he get another unit of blood today.  His bleeding may be slowing and may not need acute colonoscopy since he has known diverticulosis.  Electronic Signatures: Scot JunElliott, Harim Bi T (MD)  (Signed on 07-Sep-13 09:29)  Authored  Last Updated: 07-Sep-13 09:29 by Scot JunElliott, Kennadie Brenner T (MD)

## 2014-08-25 NOTE — Consult Note (Signed)
Pt with likely LGI bleed from diverticulosis, now with neg GI scan,  he likely has stopped.  Continue clear liquids only for now.  I will follow but no need for colonoscopy since he had one 2 years ago showing diverticulosis of sigmoid and descending colon. Dr. Mayo AoFlemming is his pulmonologist.  Electronic Signatures: Scot JunElliott, Amyah Clawson T (MD)  (Signed on 25-Feb-13 18:48)  Authored  Last Updated: 25-Feb-13 18:48 by Scot JunElliott, Kenn Rekowski T (MD)

## 2014-08-25 NOTE — Consult Note (Signed)
Chief Complaint:   Subjective/Chief Complaint hemodynamically stable, no recurrent rectal bleeding, no bm since admission.   VITAL SIGNS/ANCILLARY NOTES: **Vital Signs.:   23-Jun-13 09:37   Temperature Temperature (F) 97.7   Celsius 36.5   Temperature Source Oral   Pulse Pulse 62   Respirations Respirations 18   Systolic BP Systolic BP 119   Diastolic BP (mmHg) Diastolic BP (mmHg) 68   Mean BP 85   Systolic BP Systolic BP 117   Diastolic BP (mmHg) Diastolic BP (mmHg) 66   Systolic BP Systolic BP 119   Diastolic BP (mmHg) Diastolic BP (mmHg) 68   Systolic BP Systolic BP 123   Diastolic BP (mmHg) Diastolic BP (mmHg) 76   Pulse Ox % Pulse Ox % 96   Pulse Ox Activity Level  At rest   Oxygen Delivery Room Air/ 21 %   Brief Assessment:   Cardiac Regular    Respiratory clear BS    Gastrointestinal details normal Nontender  Bowel sounds normal  obese, unable to palpate internal organs.   Lab Results: Routine Hem:  21-Jun-13 09:29    Hemoglobin (CBC)  9.4    18:00    Hemoglobin (CBC)  8.9 (Result(s) reported on 22 Oct 2011 at 07:25PM.)  22-Jun-13 01:45    Hemoglobin (CBC)  8.4    11:10    Hemoglobin (CBC)  8.2 (Result(s) reported on 23 Oct 2011 at 11:52AM.)  23-Jun-13 07:29    WBC (CBC) 6.4   RBC (CBC)  3.10   Hemoglobin (CBC)  8.6   Hematocrit (CBC)  27.9   Platelet Count (CBC) 270   MCV 90   MCH 27.6   MCHC  30.7   RDW  15.5   Neutrophil % 57.6   Lymphocyte % 14.5   Monocyte % 9.2   Eosinophil % 17.7   Basophil % 1.0   Neutrophil # 3.7   Lymphocyte #  0.9   Monocyte # 0.6   Eosinophil #  1.1   Basophil # 0.1 (Result(s) reported on 24 Oct 2011 at 08:29AM.)   Assessment/Plan:  Assessment/Plan:   Assessment 1) rectal bleeding-abnormal DRE, possible mass versus stool 2) multiple medical problems, h/o copd, chf, obesity.    Plan 1) will start some miralax today, tap water enema in am for fles sig tomorrow.  I have discussed the risks benefits and  complications of flex sid to include not limited to bleeding infection perforation and he wishes to proceed.  This will be an unsedated proceedure due to patients comorbidities and current clinical situation. Continue clears for now.   Electronic Signatures: Barnetta ChapelSkulskie, Martin (MD)  (Signed 23-Jun-13 15:58)  Authored: Chief Complaint, VITAL SIGNS/ANCILLARY NOTES, Brief Assessment, Lab Results, Assessment/Plan   Last Updated: 23-Jun-13 15:58 by Barnetta ChapelSkulskie, Martin (MD)

## 2014-08-25 NOTE — Consult Note (Signed)
PATIENT NAME:  Nicholas Raymond, Nicholas Raymond MR#:  [id number] DATE OF BIRTH:  07-23-1932  DATE OF CONSULTATION:  06/28/2011  REFERRING PHYSICIAN:   CONSULTING PHYSICIAN:  Scot Junobert T. Elliott, MD  HISTORY OF PRESENT ILLNESS: Patient is a 81 year old white male who had multiple episodes of painless rectal bleeding last night, had another one today, came to the ER, had another one, was found to be anemic with hemoglobin 7.4 and have orthostatic hypotension therefore he was admitted to the hospital for GI bleeding. I was asked to see him in consultation for this problem.   Patient had colonoscopy in January 2011, showed multiple small and large diverticula in the sigmoid and descending colon. Patient had a GI bleeding scan today after being evaluated in the ER and bleeding scan showed no active bleeding at this time.   PAST MEDICAL/SURGICAL HISTORY:  1. Bilateral knee replacements. 2. Right carpal tunnel release November 2008. 3. Intestinal blockage requiring surgery age 81.  4. Upper endoscopy unremarkable in January 2011.  5. Rheumatoid arthritis.  6. Chronic obstructive pulmonary disease secondary to smoking.  7. Obstructive sleep apnea. He wears oxygen 2 L/min at night.  8. Benign prostatic hypertrophy.  9. Proximal atrial fibrillation.  10. Chronic anemia.   ALLERGIES: Lodine.   MEDICATIONS: 1. Leflunomide 20 mg a day.  2. Vitamin B12 1000 mcg a day.  3. Vicodin b.i.d. p.r.n.  4. Avodart 0.5 mg daily. 5. Omeprazole 20 mg daily. 6. Prednisone 5 mg a day.   REVIEW OF SYSTEMS: He denies any chest pain. He does have chronic shortness of breath. He denies any abdominal pain, denies any dysuria although he does have frequent uncontrolled urination at times. At this moment in time he is not having abdominal pain or chest pain. No dysphagia. No nausea or vomiting. He has swelling in his legs and possible cellulitis in the past. He had an upper respiratory infection last week and took a Z-Pak for this    PHYSICAL EXAMINATION:  GENERAL: White male, looks stated age with some shortness of breath when he reclines.   HENT: Sclera nonicteric. Conjunctivae somewhat pale. Tongue slightly pale.   HEAD: Atraumatic.   CHEST: Globally decreased air flow. I cannot hear any wheezes.   HEART: 2/6 systolic murmur.   ABDOMEN: Obese. Midline scar present. No hepatosplenomegaly. No masses. No bruits.   EXTREMITIES: Changes of chronic edema and congestion. No obvious cellulitis at this time.   NOTE: Patient is not a Jehovah's Witness.   LABORATORY, DIAGNOSTIC AND RADIOLOGICAL DATA: Glucose 123, BUN 21, creatinine 0.83, sodium 143, potassium 4.1, chloride 104, CO2 33, calcium 8.2, total protein 6.4, albumin 2.7, total bilirubin 0.5, alkaline phosphatase 98, SGOT 20, SGPT 25, white blood count 6.5, hemoglobin 7.4, hematocrit 24, platelet count 322. A+ blood with negative antibody screen. Pro time 13.3, INR 1. Urinalysis essentially negative. Bleeding scan showed no active bleeding at this time. Ultrasound of lower extremities showed no deep vein thromboses in either lower extremities.   ASSESSMENT: Given his bleeding story from yesterday, his anemia, history of diverticulosis and negative bleeding scan at this time it appears that he has had a self-limited spell of diverticular bleeding.   RECOMMENDATIONS:  1. I would go ahead and transfuse a couple of units of blood.  2. Put him on oxygen 2 liters at night. 3. No indication for colonoscopy at this time given his colonoscopy two years ago with finding of diverticulosis and the stopping of his bleeding. I would give him clear liquids only  at this time. Check hemoglobin at least daily. I will follow with you.   ____________________________ Scot Jun, MD rte:cms D: 06/28/2011 18:44:19 ET T: 06/29/2011 05:37:02 ET JOB#: [id number]  cc: Scot Jun, MD, <Dictator> Steele Sizer, MD Kandyce Rud., MD Herbon E. Meredeth Ide, MD Scot Jun MD ELECTRONICALLY SIGNED 07/07/2011 14:50

## 2014-08-25 NOTE — Consult Note (Signed)
Brief Consult Note: Diagnosis: hematochezia.   Patient was seen by consultant.   Consult note dictated.   Recommend to proceed with surgery or procedure.   Recommend further assessment or treatment.   Orders entered.   Comments: Patient seen and examined full consult dictated.  Patietn presenting with rectal bleeding.  similar episode in 06/2011.  No bleeding since admission noted, no bm since admission.  Denies abd pain.  On rectal exam there is possible posterior rectal mass, versus stool, however no stool on the glove, dark blood.  Continue current, transfuse as needed.  Will arrange for flexible sigmoidoscopy, unsedated versus light sedation on monday.  Following.  Electronic Signatures for Addendum Section:  Barnetta ChapelSkulskie, Irlanda Croghan (MD) (Signed Addendum 22-Jun-13 17:47)  see consult # [phone]   Electronic Signatures: Barnetta ChapelSkulskie, Mina Babula (MD)  (Signed [phone]-Jun-13 17:46)  Authored: Brief Consult Note   Last Updated: 22-Jun-13 17:47 by Barnetta ChapelSkulskie, Casimira Sutphin (MD)

## 2014-08-25 NOTE — Consult Note (Signed)
PATIENT NAME:  Nicholas Raymond, Nicholas Raymond MR#:  [id number] DATE OF BIRTH:  [birth year]  DATE OF CONSULTATION:  10/23/2011  REFERRING PHYSICIAN:  Dr. Camillo FlamingKamran Lateef  CONSULTING PHYSICIAN:  Christena DeemMartin U. Damien Batty, MD  REASON FOR CONSULTATION: Rectal bleeding.   HISTORY OF PRESENT ILLNESS: Mr. Nicholas Raymond is a 81 year old Caucasian male who was admitted to the hospital yesterday with rectal bleeding. He states yesterday morning he had a bowel movement and this was accompanied by a lot of blood. He had several episodes of this. He did have a similar hospitalization in February of this year. At that time the bleeding seemed to stop and it was felt to be related to diverticular bleeding. However, patient did have a colonoscopy on 05/29/2009 showing diverticulosis but also had a poor prep. There is no repeat colonoscopy or luminal evaluation on this hospitalization in February. He states that the last bowel movement he had was actually yesterday morning. He has had no repeat bowel movement or rectal bleeding since he came into the hospital. He states that he does take a daily aspirin but he takes no medication for reflux. He denies any problems with nausea, vomiting, or abdominal pain. He has no pain around the rectum. He does occasionally have some rare heartburn but no dysphagia. He has no history of peptic ulcer disease. GI family history negative for colorectal cancer, liver disease or ulcers. Patient is quite obese. It was very difficult for him to move around in the bed even to better allow examination.   PAST MEDICAL HISTORY:  1. History of chronic obstructive pulmonary disease. 2. Obstructive sleep apnea and uses oxygen at night at home.  3. History of an intestinal blockage age 81 for which he underwent surgery.  4. History of paroxysmal atrial fibrillation. 5. Renal insufficiency.  6. History of sigmoid and descending colon diverticulosis as noted.  7. History of chronic anemia and iron deficiency.  8. History of  chronic diastolic congestive heart failure with lower extremity edema.  9. History of a right carpal tunnel release in November 2008. 10. Bilateral knee replacements.   REVIEW OF SYSTEMS: 10 systems reviewed per admission history and physical. Chronic generalized weakness noted, chronic lower extremity edema, negative otherwise.   PHYSICAL EXAMINATION:  VITAL SIGNS: Temperature 97.6, pulse 60, respirations 18, blood pressure 136/65, pulse oximetry 95% on room air.   GENERAL: He is an obese 81 year old Caucasian male, no acute distress.   HEENT: Normocephalic, atraumatic. Eyes are anicteric. Nose septum midline. Oropharynx no lesions.   NECK: Supple. No JVD.   HEART: Regular rate and rhythm.   LUNGS: Clear.   ABDOMEN: Obese, nontender. Bowel sounds are positive. It is not possible to palpate internal organs.   RECTAL: Anorectal examination shows a possible rectal mass versus impacted stool in the posterior rectal vault. There is some gross blood on the examining glove which is fairly dark.   EXTREMITIES: 1 to 2+ lower extremity edema.   NEUROLOGICAL: Cranial nerves II through XII grossly intact.   LABORATORY, DIAGNOSTIC, AND RADIOLOGICAL DATA: On admission to the hospital he had a glucose 121, BUN 22, creatinine 0.89, sodium 143, potassium 4.3, chloride 107, lipase 191. His BUN today was 19. On admission to the hospital he had a hemogram with a white count 8.3, hemoglobin and hematocrit 9.4/30.1, platelet count 286. His hemoglobin has slightly drifted over the course of his hospitalization to 8.9, 8.4, currently as of 11:10 this morning at 8.2. However, there has been no registry of bowel movement in the  interim and patient denies any further rectal bleeding since coming to the hospital. He has had no imaging studies. His pro time 13.6, INR 1.0, activated PTT 30.9.   ASSESSMENT: Hematochezia. Second episode within the past four months. On digital rectal examination I am concerned there  could possibly be a posterior rectal mass.   RECOMMENDATION:  1. Continue current. 2. Will plan for a nonsedated flexible sigmoidoscopy on Monday.   ____________________________ Christena Deem, MD mus:cms D: 10/23/2011 17:42:41 ET T: 10/24/2011 07:34:58 ET JOB#: [id number]  cc: Christena Deem, MD, <Dictator> Christena Deem MD ELECTRONICALLY SIGNED 10/27/2011 12:07

## 2014-08-25 NOTE — Consult Note (Signed)
Chief Complaint:   Subjective/Chief Complaint tolerated enemas well, large bm last night.  no recurrence of bleeding. no abdominal pain.   VITAL SIGNS/ANCILLARY NOTES: **Vital Signs.:   24-Jun-13 09:33   Vital Signs Type Q 4hr   Temperature Temperature (F) 97.7   Celsius 36.5   Temperature Source oral   Pulse Pulse 70   Pulse source per vital sign device   Respirations Respirations 18   Systolic BP Systolic BP 107   Diastolic BP (mmHg) Diastolic BP (mmHg) 60   Mean BP 75   BP Source vital sign device   Pulse Ox % Pulse Ox % 95   Pulse Ox Activity Level  At rest   Oxygen Delivery Room Air/ 21 %  *Intake and Output.:   24-Jun-13 12:48   Stool  bm mixed in urine. unable to tell how much stool; urine was light brown and cloudy with a few tiny stool   Brief Assessment:   Cardiac Regular    Respiratory rhonchi  mild bilateral    Gastrointestinal details normal Soft  Nontender  obese, unable to palpate internal organs.   Lab Results: Routine Hem:  24-Jun-13 06:21    WBC (CBC) 5.6   RBC (CBC)  3.04   Hemoglobin (CBC)  8.4   Hematocrit (CBC)  27.2   Platelet Count (CBC) 251   MCV 89   MCH 27.8   MCHC  31.1   RDW  15.6   Neutrophil % 52.6   Lymphocyte % 15.7   Monocyte % 12.2   Eosinophil % 18.4   Basophil % 1.1   Neutrophil # 2.9   Lymphocyte #  0.9   Monocyte # 0.7   Eosinophil #  1.0   Basophil # 0.1 (Result(s) reported on 25 Oct 2011 at 07:04AM.)   Assessment/Plan:  Assessment/Plan:   Assessment 1) hematochezia, abnormal rectal exam on DRE, no recurrent bleeding since admission, likely constipation.    Plan 1) Flexible sigmoidoscopy today.  I have discussed the risks benefits and complications of flex sig to include not limited to bleeding infection and perforation of the bowel and patient wishes to proceed. further recs to follow.  would continue miralax at home for constipation.   Electronic Signatures: Barnetta ChapelSkulskie, Eternity Dexter (MD)  (Signed 24-Jun-13  13:07)  Authored: Chief Complaint, VITAL SIGNS/ANCILLARY NOTES, Brief Assessment, Lab Results, Assessment/Plan   Last Updated: 24-Jun-13 13:07 by Barnetta ChapelSkulskie, Kahmari Koller (MD)

## 2014-08-25 NOTE — Discharge Summary (Signed)
PATIENT NAME:  Nicholas SallesCOMBS, Nicholas Raymond MR#:  [id number] DATE OF BIRTH:  [birth year]  DATE OF ADMISSION:  10/22/2011 DATE OF DISCHARGE:  10/26/2011  PRIMARY CARE PHYSICIAN: Vonita MossMark Crissman, MD   RHEUMATOLOGIST: Dr. Gavin PottersKernodle   CONSULTANT: Dr. Marva PandaSkulskie from GI   CHIEF COMPLAINT: Bloody stools.   DISCHARGE DIAGNOSES:  1. Acute lower gastrointestinal bleed, likely diverticular. 2. Anemia, likely posthemorrhagic and anemia of chronic disease. 3. Hypotension.   SECONDARY DIAGNOSES:  1. Rheumatoid arthritis. 2. Chronic obstructive pulmonary disease. 3. Obstructive sleep apnea. 4. Benign prostatic hypertrophy.  5. History of paroxysmal atrial fibrillation. 6. History of renal insufficiency, which is resolved.  7. History of diverticulosis. 8. History of chronic anemia with history of iron deficiency.  9. History of chronic diastolic congestive heart failure.  10. History of chronic lower extremity edema.   DISCHARGE MEDICATIONS:  1. Aspirin 81 mg daily.  2. Prednisone 2.5 mg daily.  3. Leflunomide 20 mg daily.  4. Vitamin B12 1 tab p.o. daily. 5. Ferrous sulfate 325 mg 1 tab daily.   DIET: GI soft low residue diet for four weeks, then low salt diet.   ACTIVITY: As tolerated.   FOLLOWUP: Please follow-up with your primary care physician and check a CBC within one week.   DISPOSITION: Home.   HISTORY OF PRESENT ILLNESS: For full details of the history and physical, please see the dictation on 10/22/2011 by Dr. Cherylann RatelLateef. Briefly, this is a 81 year old male with history of diverticulosis who presented with bright red blood per rectum the day prior to admission. Of note, the patient has had recent hospitalizations for diverticular bleed. On arrival blood pressure was 81/46 and improved with IV fluids to 111/58. He was admitted to the hospitalist service for further evaluation and management.   SIGNIFICANT LABS AND IMAGING: Initial BUN 22, creatinine 0.89, glucose 121, lipase 191, ferritin 19, iron  serum 30, iron binding capacity 265. LFTs albumin 2.6, otherwise within normal limits. Troponin negative x1. Initial hemoglobin 9.4, trended down to 8, hematocrit 30.1 on arrival, WBC on arrival 8.3. INR 1. Urinalysis had no nitrites, 2+ leukocyte esterase, 3 WBCs, no bacteria.   HOSPITAL COURSE: The patient was admitted to the hospitalist service and started on gentle IV fluids. The patient did have some dizziness and hypotension on arrival. He did not have any further GI bleed. Iron studies were sent which are more consistent with anemia of chronic disease. He underwent flexible sigmoidoscopy per Dr. Marva PandaSkulskie on 10/25/2011. No acute bleed was found, however, there was multiple diverticulosis. With IV fluid resuscitation his pressures have been better. He has not been tachycardic nor dizzy anymore. He is ambulating with his usual walker without any significant difficulty. He has chronic shortness of breath, not new. His aspirin was held while hospitalized, however, it could be resumed as an outpatient. I discussed the case with Dr. Marva PandaSkulskie this afternoon and, per him, the patient is dischargeable with outpatient follow-up with his primary care physician and a CBC check within one week.   DISPOSITION: Home.   CODE STATUS: FULL CODE.     TOTAL TIME SPENT: 35 minutes.  ____________________________ Krystal EatonShayiq Erek Kowal, MD sa:drc D: 10/26/2011 14:21:30 ET T: 10/27/2011 11:05:09 ET JOB#: [id number]  cc: Krystal EatonShayiq Remigio Mcmillon, MD, <Dictator> Steele SizerMark A. Crissman, MD Krystal EatonSHAYIQ Xandra Laramee MD ELECTRONICALLY SIGNED 11/06/2011 13:08

## 2014-08-25 NOTE — H&P (Signed)
PATIENT NAME:  Nicholas Raymond, Nicholas Raymond MR#:  [id number] DATE OF BIRTH:  01-Oct-1932  DATE OF ADMISSION:  07/04/2011  PRIMARY CARE PHYSICIAN: Vonita Moss, MD  CHIEF COMPLAINT: Shortness of breath.   HISTORY OF PRESENT ILLNESS: The patient is a 81 year old male who was just recently discharged from the hospital, returns back today secondary to shortness of breath. The patient says that when he left the hospital he was not having much shortness of breath, but it started about a couple of days ago and has progressively gotten worse. The patient is on home oxygen and does have a history of chronic obstructive pulmonary disease. He tried using his albuterol inhalers, but it did not seem to improve his symptoms. The patient had some increased audible wheezing and worsening exertional shortness of breath. He was, therefore, brought to the ER for further evaluation. Upon arrival, the patient was noted to be hypoxic, audibly wheezing, and with mild-to-moderate respiratory distress. The patient is being admitted for further evaluation of his respiratory failure which is likely suspected due to underlying mild congestive heart failure and also chronic obstructive pulmonary disease exacerbation. The patient denies any fevers, he denies any nausea, vomiting, abdominal pain, or any chest pain. He does admit to a cough, but it is productive with clear sputum. He denies any recent sick contacts but was admitted to the hospital for some GI bleed and anemia about a week or so ago.   REVIEW OF SYSTEMS: CONSTITUTIONAL: No documented fever. No weight gain, no weight loss. EYES: No blurry or double vision. ENT: No tinnitus or postnasal drip. No redness of the oropharynx. RESPIRATORY: Positive cough. No wheeze, no hemoptysis. CARDIOVASCULAR: No chest pain, no orthopnea, no palpitations or syncope. GI: No nausea, no vomiting, no diarrhea, no abdominal pain, no melena, no hematochezia. GU: No dysuria or hematuria. ENDOCRINE: No polyuria or  nocturia. No heat or cold intolerance. HEMATOLOGIC: No anemia, no bruising. No bleeding. INTEGUMENTARY: No rashes. No lesions. MUSCULOSKELETAL: No arthritis, no swelling, no gout. NEUROLOGIC: No numbness, no tingling, no ataxia, no seizure-type activity. PSYCHIATRIC: No anxiety, no insomnia, no ADD.   PAST MEDICAL HISTORY:  1. Chronic obstructive pulmonary disease. 2. Obstructive sleep apnea. 3. Rheumatoid arthritis.  4. Benign prostatic hypertrophy.  5. Chronic anemia secondary to a diverticular bleed.  6. Chronic lower extremity edema.  7. History of diastolic congestive heart failure.   ALLERGIES: No known drug allergies.   SOCIAL HISTORY: He used to be a smoker, quit about a year ago. No alcohol abuse. No illicit drug abuse. He lives at home with his wife.   FAMILY HISTORY: The patient's mother and father died from, according to him, natural causes.   CURRENT MEDICATIONS: (Based on the recent Discharge Summary and Instructions)  1. Avodart 0.5 mg daily.  2. Prednisone 5 mg daily.  3. Tylenol with hydrocodone 1 tab b.i.d. as needed. 4. Leflunomide 20 mg daily.  5. Vitamin B12 1000 mcg daily. 6. Prilosec 20 mg b.i.d.  7. Advair 150, 1 puff b.i.d.  8. Iron sulfate 325 mg daily with meals.  9. Albuterol inhaler 2 puffs every 6 hours p.r.n.   PHYSICAL EXAMINATION:  VITAL SIGNS: On admission, temperature is 96.8, pulse 76, respirations 20, blood pressure 136/74, saturations 98% on 2 liters nasal cannula.   GENERAL: He is a pleasant-appearing male in mild respiratory distress.   HEENT: Atraumatic, normocephalic. Extraocular muscles are intact. Pupils are equal and reactive to light. Sclerae are anicteric. No conjunctival injection. No pharyngeal erythema.  NECK: Supple. There is no jugular venous distention, no bruits, no lymphadenopathy, no thyromegaly.   HEART: Regular rate and rhythm. No murmurs, no rubs, no clicks.   LUNGS: He has some diffuse rhonchi and wheezing. Negative  use of accessory muscles. No dullness to percussion.   ABDOMEN: Soft, flat, distended, good bowel sounds. No hepatosplenomegaly appreciated.   EXTREMITIES: No evidence of any cyanosis or clubbing. He does have +1 to 2 pitting edema from the knees to the ankles bilaterally. Faint pedal pulses with +2 radial pulses bilaterally.   NEUROLOGICAL: The patient is alert, awake, and oriented x3 with no focal motor or sensory deficits appreciated bilaterally.   SKIN: Moist and warm with no rash appreciated.   LYMPHATIC: There is no cervical or lymphadenopathy.   LABORATORY, DIAGNOSTIC AND RADIOLOGICAL DATA:  Glucose 109, BUN 19, creatinine 0.7, sodium 144, potassium 4.2, chloride 103, bicarbonate 34. LFTs are within normal limits.  Troponin less than 0.02.  White cell count 11.9, hemoglobin 8.9, hematocrit 29, platelet count 311. Prothrombin time is 14.3, INR 1.1.  Urinalysis is within normal limits.  The patient did have a chest x-ray done which showed congestive heart failure with possible superimposed left lower lobe pneumonia.   ASSESSMENT AND PLAN: The patient is a 81 year old male with a history of chronic obstructive pulmonary disease, history of congestive heart failure, obstructive sleep apnea, benign prostatic hypertrophy, rheumatoid arthritis, GI bleed secondary to diverticulosis, and chronic anemia who presents to the hospital with shortness of breath.   1. Acute respiratory failure: This is likely secondary to a combination of mild congestive heart failure and also chronic obstructive pulmonary disease exacerbation. For now, I will treat both, start the patient on gentle diuresis with IV Lasix, follow ins and outs and daily weights. I will also start the patient on around-the-clock nebulizer treatments, empiric IV steroids, and empiric antibiotics with vancomycin and Zosyn, and follow him clinically.  2. Chronic obstructive pulmonary disease exacerbation: Likely secondary to suspected  nosocomial pneumonia as he was recently in the hospital. Start him on IV vancomycin with Zosyn, IV steroids, around-the-clock nebulizer treatments, continue his Advair. I will go ahead and add Spiriva, and follow his cultures, and follow him clinically for now. He already is on home oxygen.  3. Congestive heart failure: This is chronic diastolic dysfunction. As mentioned, I will go ahead and start him on gentle IV diuresis, follow ins and outs and daily weights, and follow BUN and creatinine.  4. Benign prostatic hypertrophy: Continue Avodart.  5. Rheumatoid arthritis: Continue steroids and leflunomide.  6. Gastroesophageal reflux disease: Continue Prilosec.  7. Chronic anemia: Continue iron supplements for now.   CODE STATUS:  The patient is a FULL CODE.     TIME SPENT: 50 minutes.  ____________________________ Rolly PancakeVivek J. Cherlynn KaiserSainani, MD vjs:cbb D: 07/04/2011 15:39:51 ET T: 07/04/2011 15:53:18 ET JOB#: [id number]  cc: Rolly PancakeVivek J. Cherlynn KaiserSainani, MD, <Dictator> Steele SizerMark A. Crissman, MD Houston SirenVIVEK J Kamilah Correia MD ELECTRONICALLY SIGNED 07/05/2011 13:28

## 2014-08-25 NOTE — H&P (Signed)
PATIENT NAME:  Nicholas Raymond, Nicholas Raymond MR#:  [id number] DATE OF BIRTH:  [birth year]  DATE OF ADMISSION:  06/28/2011  ADDENDUM:  MEDICATIONS: After reviewing medications brought from home from the patient's wife, the patient is taking the following medications.  1. Leflunomide 20 mg daily. 2. Vitamin B12 1000 mcg daily.  3. Hydrocodone/acetaminophen 5/300 one tab p.o. b.i.d. p.r.n.  4. Avodart 0.5 mg daily.  5. Omeprazole 20 mg daily. 6. Prednisone 5 mg daily. 7. The patient may also be taking a fluid pill.  PAST MEDICAL HISTORY:  1. There is an apparent diagnosis of CHF, unknown EF. This was [facility]. The patient may or may not be taking a fluid pill. Wife is unaware and patient not sure.   2. History of lower extremity cellulitis approximately six months ago for which he was on antibiotics.  3. Recent upper respiratory infection for which he completed azithromycin last week as he was having a lot of chest congestion also.   ASSESSMENT AND PLAN:  Possible congestive heart failure. As above the patient was noted to have murmur and lower extremity edema. There is no prior history of CHF documented per review of old records in our system. Apparently [facility]. Need to obtain records. In the meanwhile, will further assess by obtaining 2-D echocardiogram and EKG. The patient's blood pressure is too low at this time to tolerate diuretic therapy as he has orthostatic hypotension with symptomatic anemia and profound volume loss from rectal bleeding.      Also, for possible CHF would not give any beta-blockers or ACE inhibitors acutely given his hypotension.   ____________________________ Elon AlasKamran N. Chessica Audia, MD knl:drc D: 06/28/2011 17:33:28 ET T: 06/28/2011 17:50:53 ET JOB#: [id number]  cc: Elon AlasKamran N. Michalina Calbert, MD, <Dictator> Steele SizerMark A. Crissman, MD Elon AlasKAMRAN N Sharonann Malbrough MD ELECTRONICALLY SIGNED 07/07/2011 21:06

## 2014-08-25 NOTE — Discharge Summary (Signed)
PATIENT NAME:  Nicholas Raymond, Nicholas Raymond MR#:  [id number] DATE OF BIRTH:  01/09/1933  DATE OF ADMISSION:  06/28/2011 DATE OF DISCHARGE:  06/30/2011  ADMITTING PHYSICIAN: Dr. Camillo Flaming  DISCHARGING PHYSICIAN: Dr. Enid Baas  PRIMARY CARE PHYSICIAN: Dr. Vonita Moss.  CONSULTATION IN THE HOSPITAL: GI consultation by Dr. Mechele Collin.  DISCHARGE DIAGNOSES: 1. Acute on chronic anemia secondary to lower gastrointestinal bleed.  2. Lower gastrointestinal bleed, diverticular bleed.  3. Chronic bilateral lower extremity edema.  4. Mild diastolic congestive heart failure.  5. Obstructive sleep apnea.  6. Chronic obstructive pulmonary disease.  7. Rheumatoid arthritis.  8. Benign prostatic hypertrophy.   DISCHARGE MEDICATIONS:  1. Avodart 0.5 mg p.o. daily.  2. Vicodin 300/5 mg p.o. b.i.d. p.r.n.  3. Prednisone 5 mg p.o. daily.  4. Leflunomide 20 mg p.o. daily.  5. Vitamin B12 1000 mcg p.o. daily.  6. Prilosec 20 mg p.o. b.i.d.  7. Advair 100/50, 1 puff b.i.d.  8. Ferrous sulfate 325 mg p.o. daily with meals.  9. Albuterol inhaler 2 puffs q.6h. p.r.n.   DISCHARGE HOME OXYGEN: 2 liters nocturnal oxygen.   DISCHARGE DIET: Low sodium diet.   DISCHARGE ACTIVITY: As tolerated.   FOLLOW UP INSTRUCTIONS:  1. Primary care physician follow up in two weeks.  2. GI follow up in 1 to 2 weeks.  3. Home health physical therapy and nursing.   LABORATORY, DIAGNOSTIC AND RADIOLOGICAL DATA: Labs at the time of discharge: WBC 7.3, hemoglobin 8.3, hematocrit 27.6, platelet count 285.   Sodium 145, potassium 3.8, chloride 104, bicarbonate 32, BUN 16, creatinine 0.79, glucose 100, calcium 8.2. Urinalysis negative for any infection. INR 1.0.   ALT 25, AST 20, alkaline phosphatase 98, total bilirubin 0.5, albumin 3.7. Patient has received total of two units of packed red blood cell transfusion during this admission. GI blood loss study: Bleeding scan showing no evidence of active gastrointestinal hemorrhage.  Ultrasound Doppler bilateral lower extremities showing no evidence of any deep venous thrombosis in both lower extremities. Echocardiogram showing mildly dilated left atrium and other chambers are normal size and normal ejection fraction. Mild diastolic dysfunction is noticed. Mild aortic stenosis could be present.   BRIEF HOSPITAL COURSE: Nicholas Raymond is a 81 year old pleasant obese Caucasian male with past medical history significant for diverticulosis, rheumatoid arthritis, obstructive sleep apnea and chronic obstructive pulmonary disease who presented to the hospital complaining of:  1. Painless rectal bleeding. Patient also has been feeling weak and tired. Denied any abdominal pain. He was orthostatically positive with blood pressure dropping from 111/66 to 76/68 on standing. His hemoglobin was as low as 7.4. Patient had an upper GI endoscopy and colonoscopy done in January 2011 by Dr. Bluford Kaufmann and the upper GI endoscopy was completely normal whereas the lower colonoscopy showed large diverticula in the sigmoid and descending colon. Patient also had a GI bleeding study on admission which was negative. He was seen by Dr. Mechele Collin who agreed for the blood transfusion to keep his hemoglobin up and also suggested that this could be a diverticular bleed. Patient's diet was changed to clear liquids and has been slowly advanced. He has not had further bleeding while in the hospital. His hemoglobin has been stable and he is being discharged home with GI follow up as outpatient.  2. Chronic obstructive pulmonary disease with obstructive sleep apnea. Weight reduction is necessary. He is already on nocturnal oxygen and might benefit from CPAP as an outpatient. He is on Advair for that and 2 liters oxygen.  3. Iron deficiency anemia, on iron supplements. Ferrous sulfate with meals.  4. Rheumatoid arthritis. He follows up with Dr. Gavin [facility]Kernodle as an outpatient on prednisone and leflunomide.  5. His course has been otherwise  uneventful in the hospital.   DISCHARGE CONDITION: Stable.   DISCHARGE DISPOSITION: Home with home health.   TIME SPENT ON DISCHARGE: 45 minutes.  ____________________________ Enid Baasadhika Analys Ryden, MD rk:cms D: 07/02/2011 11:27:07 ET T: 07/02/2011 13:55:54 ET JOB#: [id number]  cc: Enid Baasadhika Monty Spicher, MD, <Dictator> Steele SizerMark A. Crissman, MD Scot Junobert T. Elliott, MD Enid BaasADHIKA Nahun Kronberg MD ELECTRONICALLY SIGNED 07/05/2011 13:51

## 2014-08-25 NOTE — H&P (Signed)
PATIENT NAME:  Nicholas Raymond, Nicholas Raymond MR#:  [id number] DATE OF BIRTH:  1932-05-22  DATE OF ADMISSION:  10/22/2011  REFERRING PHYSICIAN:  Dr. Daryel November PRIMARY CARE PHYSICIAN:  Dr. [name] PRIMARY RHEUMATOLOGIST:  Dr. Lavenia Atlas  CHIEF COMPLAINT: "I've got blood in my stool."   HISTORY OF PRESENT ILLNESS: The patient is a pleasant 81 year old male with past medical history as listed below who presents to the Emergency Department with the above-mentioned chief complaint. The patient states that he had been in his usual state of health until this morning when he developed blood in his stool, which initially started out as dark stool and later progressed to frank blood, multiple episodes with bright red blood per blood per rectum and painless rectal bleeding. He states he may have lost approximately 3 gallons of blood in the toilet with his last bowel movement, for which he came to the ER for further evaluation. The last episode of bleeding/bloody stools was about 4 to 5 hours ago before he came to the hospital. He has had none since he came to the hospital. He also reports associated dizziness and lightheadedness. He has chronic weakness which has not recently worsened. He denies any chest pain or shortness of breath. He denies any other complaint at this time. Blood pressure was 81/46 on arrival and after some IV fluids has improved to 111/58. Hemoglobin down to 9.4. Otherwise, the patient is without specific complaints at this time. Hospitalist services were contacted for further evaluation and for hospital admission.   PAST SURGICAL HISTORY:  1. Bilateral knee replacement. 2. Right carpal tunnel release 03/2007. 3. Surgery for intestinal blockage at age 50.  4. EGD unremarkable 05/29/2009. 5. Colonoscopy revealing diverticulosis 05/29/2009.  PAST MEDICAL HISTORY:  1. Rheumatoid arthritis.  2. Chronic obstructive pulmonary disease. 3. Obstructive sleep apnea. Patient with nocturnal oxygen at  two liters per minute via nasal cannula. Benign prostatic hypertrophy.  4. History of intestinal blockage at age 62 requiring unspecified surgery.  5. Hospitalization 05/2007 for strep pneumonia and bacteremia.  6. History of paroxysmal atrial fibrillation. 7. History of renal insufficiency, which had resolved. 8. Sigmoid and descending colon diverticulosis noted on colonoscopy 05/29/2009 with hospitalization February 2013 for a lower GI bleed felt to be diverticular in nature, requiring blood transfusion.  9. Chronic anemia with a history of iron deficiency. 10. Chronic diastolic congestive heart failure.  11. Chronic lower extremity edema.   ALLERGIES: Lodine.   HOME MEDICATIONS:  1. Aspirin 81 mg p.o. daily.  2. Prednisone 2.5 mg daily. 3. Leflunomide 20 mg p.o. daily.  4. Vitamin B12 1 tab p.o. daily.   FAMILY HISTORY: Mother died at age 24 of Alzheimer's disease. Father died at age 69 of natural causes. Brother died at age 41 from unknown type of cancer. Sister also died of natural causes.   SOCIAL HISTORY: Tobacco- quit approximately 10 years ago. In the past he smoked 1.5 packs per day for at least 25 years. Alcohol- none. Illicit drugs- none. The patient is retired. He used to work in the [employer]. He lives at home with his [place]   REVIEW OF SYSTEMS: CONSTITUTIONAL: Has chronic generalized weakness which has not recently worsened. Denies any pain. Denies any recent changes in weight. Denies fevers or chills. He had some dizziness and lightheadedness. HEAD/EYES: Denies headache or blurry vision. ENT: Denies tinnitus, earache, nasal discharge, or sore throat. RESPIRATORY: Denies any shortness of breath, wheezing, or cough. CARDIOVASCULAR: Denies chest pain or  heart palpitations. He does have chronic lower extremity edema. GI: Reports painless rectal bleeding/bright red blood per rectum. Denies abdominal pain, nausea, vomiting, diarrhea, or constipation.  He had some dark stools initially but currently denies melena. GU: Denies dysuria or hematuria. ENDOCRINE: Denies heat or cold intolerance. HEME/LYMPH: Denies easy bruising. Has some blood in his stool. INTEGUMENT: Denies rash. MUSCULOSKELETAL: Occasional joint pain associated with rheumatoid arthritis, none currently. Denies arthralgias. Denies any back pain at the moment. NEURO: He has chronic generalized weakness which has not recently worsened. He reports dizziness and lightheadedness. Denies numbness, tingling, dysarthria, or headache.  PSYCH: Denies depression or anxiety.  Denies suicidal or homicidal ideation.   PHYSICAL EXAMINATION:  VITAL SIGNS: Temperature 96.5, pulse 88, blood pressure 81/46 on arrival, currently 111/58 after IV fluids, respirations 18, oxygen saturation 94% on 2 liters nasal cannula.   GENERAL: Obese male who is alert and oriented, not acutely distressed.   HEENT: Normocephalic, atraumatic. Pupils are equal, round, and reactive to light accommodation. Extraocular muscles are intact. Anicteric sclerae. Bilateral conjunctival pallor. Hearing intact to voice. Nares without drainage. Oral mucosa moist without lesions. The patient has poor dentition.   NECK: Supple with full range of motion. No jugular venous distention, lymphadenopathy, or carotid bruits bilaterally. No thyromegaly or tenderness to palpation over the thyroid gland.  CHEST: Normal respiratory effort without use of accessory respiratory muscles. Lungs are clear to auscultation bilaterally without crackles, rales, or wheezes.    CARDIOVASCULAR: S1, S2 positive. Regular rate and rhythm. 1/6 systolic ejection murmur. PMI is non-lateralized.   ABDOMEN: Obese, soft, nontender, nondistended. Normoactive bowel sounds. No hepatosplenomegaly or palpable masses. No hernia. Evidence of scar to the left of his midline. Scar appears to be intact.   EXTREMITIES: 2+ pitting edema of bilateral lower extremities without  clubbing, cyanosis, erythema, or warmth. Pedal pulses are palpable bilaterally.  The patient states that his lower extremity edema has not recently worsened.   SKIN: No suspicious rashes or lesions. Skin turgor is somewhat diminished.   LYMPH: No cervical lymphadenopathy.   NEUROLOGICAL: The patient is alert, oriented times three. Cranial nerves II-XII are grossly intact.  No focal deficits.  PSYCH: Pleasant male with appropriate affect.  LABORATORY AND RADIOLOGICAL DATA: Complete metabolic panel within normal limits except for BUN of 22, albumin 2.6, and troponin less than 0.02.   CBC: WBC 8.3, hemoglobin 9.4, hematocrit 30.1, platelets 286. MCV is 89. INR 1.   EKG with normal sinus rhythm, heart rate 84 beats per minute, first-degree AV block, some PACs and right bundle branch block.   ASSESSMENT AND PLAN:  81 year old male with past medical history of rheumatoid arthritis, chronic anemia, chronic diastolic congestive heart failure with chronic lower extremity edema, history of diverticulosis with hospitalization 06/2011 for diverticular hemorrhage and anemia requiring blood transfusion here with bright red blood per rectum (which initially started out as dark-colored stools) and multiple episodes of painless rectal bleeding,  noted to be anemic and hypotensive.  1. Bright red blood per rectum: Suspect lower GI bleed and recommend hospital admission for further evaluation and management. Suspect possible diverticular hemorrhage given his past history as well as painless rectal bleeding on this occasion. For now we will start volume resuscitation with IV fluids and also check orthostatics as he has dizziness and lightheadedness. Hold aspirin therapy. Obtain bleeding scan if the patient rebleeds but last episode was about 4 to 5 hours ago before he came to the hospital. BUN is slightly elevated and he also stated  that he initially started out with dark stools. Therefore, in the event of upper GI  bleed we will keep the patient on IV Protonix. We will obtain serial hemoglobin and hematocrit checks and transfuse the patient as needed, especially if he remains symptomatic or hypotensive. We will obtain a gastroenterology consultation for further recommendations and management. Further work-up and management to follow depending on the patient's clinical course.  2. Acute posthemorrhagic anemia: Suspect from gastrointestinal bleed as above.  We will obtain serial hemoglobin checks and transfuse as needed. Hold aspirin therapy.  3. Hypotension: Suspect from volume depletion and acute blood loss anemia. His  blood pressure started to respond to IV fluids, which will be continued for now, and continue volume resuscitation.  Also consider blood transfusion which may also help raise his blood pressure. Monitor closely.  4. Chronic diastolic congestive heart failure: Appears clinically stable. He has chronic lower extremity, which he states has not recently worsened. Denies any shortness of breath.  If he receives blood could consider giving him Lasix in between but for now continue volume resuscitation with IV fluids as he is hypotensive. The patient is not on ACE inhibitor at baseline and would not start one acutely given hypotension.  5. Chronic obstructive pulmonary disease: Stable and without acute exacerbation. We will write for p.r.n. bronchodilator support with albuterol, metered dose inhaler, and p.r.n. DuoNebs. 6. Obstructive sleep apnea: Continue nocturnal oxygen.  7. Rheumatoid arthritis:  Continue leflunomide and prednisone. 8. Deep vein thrombosis:  TEDs. 9. CODE STATUS: FULL CODE.  TIME SPENT ON THIS ADMISSION: Approximately 60 minutes.     ____________________________  Elon AlasKamran N. Safia Panzer, MD knl:bjt D: 10/22/2011 13:30:55 ET T: 10/22/2011 14:09:14 ET JOB#: [id number]  cc: Elon AlasKamran N. Lorayne Getchell, MD, <Dictator> Steele SizerMark A. Crissman, MD Elon AlasKAMRAN N Adger Cantera MD ELECTRONICALLY SIGNED 11/02/2011 1:33

## 2014-08-25 NOTE — H&P (Signed)
PATIENT NAME:  Nicholas Raymond, Nicholas Raymond MR#:  [id number] DATE OF BIRTH:  1933-02-13  DATE OF ADMISSION:  06/28/2011  REFERRING PHYSICIAN: Dr. [name]   PRIMARY CARE PHYSICIAN: Dr. [name]   PRIMARY RHEUMATOLOGIST: Dr. Saverio Danker   CHIEF COMPLAINT: "I've got blood in my stool".   HISTORY OF PRESENT ILLNESS: The patient is a pleasant 81 year old male with past medical history as listed below who presented to the Emergency Department with complaints of multiple episodes of painless rectal bleeding starting last night. The patient states that he was told by Dr. [name] that he was anemic approximately a year ago. History was obtained from speaking with the patient and also his wife in the ER today who accompanies him. Per the wife, the patient has not seen Dr. [name] in 1 to 2 years as she states that patient does not like going to see doctors unless he "gets sick". Last night he started having blood in his stool and bright red blood noted at least 4 to 5 episodes last night. He has had one episode earlier this morning and again today in the ER. He has painless rectal bleeding. Denies any abdominal pain. Denies nausea or vomiting. He ate dinner last night which he tolerated well. He also reports dizziness and lightheadedness especially with standing. He reports generalized weakness. Denies any numbness, tingling, slurred speech, facial droop, or headaches. Denies any chest pain or shortness of breath currently. Otherwise, he is without specific complaints at this time and he came to the hospital for further evaluation of his symptoms. He was noted to be profoundly anemic with hemoglobin 7.4. He is noted to have orthostatic hypotension. Blood pressure was initially 111/66 on arrival. On later check blood pressure was 151/83 lying and then fell to 76/68 while standing. He has heme positive stools on DRE with gross blood noted. Thereafter, hospitalist services were contacted for further evaluation and for  hospital admission.   PAST SURGICAL HISTORY:  1. Bilateral knee replacements.  2. Right carpal tunnel release November 2008.  3. Surgery for intestinal blockage age 29.  4. EGD which was unremarkable 05/29/2009.  5. Colonoscopy revealing diverticulosis 05/29/2009.    PAST MEDICAL HISTORY:  1. Rheumatoid arthritis.  2. Chronic obstructive pulmonary disease.  3. Obstructive sleep apnea. The patient wears nocturnal oxygen at 2 liters per minute via nasal cannula. 4. Benign prostatic hypertrophy.  5. History of intestinal blockage age 38 requiring unspecified surgery.  6. Hospitalization January 2009. The patient was noted to have pneumonia with Strep pneumo bacteremia.  7. History of paroxysmal atrial fibrillation.  8. History of renal insufficiency, which had resolved.  9. Diverticulosis involving the sigmoid and descending colon noted on colonoscopy 05/29/2009.  10. Anemia currently being worked up and followed by Dr. [name]. The patient tells me that he has been anemic for approximately a year.   ALLERGIES: Lodine.   HOME MEDICATIONS: Need to be verified along with doses and frequencies. According to the patient and wife, he takes the following medications listed below (wife also states that she will bring in his medications from home).  1. Baby aspirin daily.  2. Prednisone, unknown strength, thinks he is taking it once a day.  3. Another medication for arthritis, starts with an L, possibly leflunomide.  4. Omeprazole.  5. Advair p.r.n.  6. Vicodin b.i.d.   FAMILY HISTORY: Mother died at age 30 of Alzheimer's disease. Father died at age 77 of natural causes. Brother died at age 74 from unknown type of cancer.  Sister also died of natural causes.   SOCIAL HISTORY: Tobacco, quit approximately 10 years ago. In the past he smoked 1.5 packs per day for at least 25 years. Alcohol none. Illicit drugs none. The patient is retired. Used to work in the [employer]. The patient lives at  home with his wife in [place], [place]. Wife accompanies him today.   REVIEW OF SYSTEMS: CONSTITUTIONAL: Reports generalized weakness. Denies any fevers, chills. Denies any pain currently. Denies any recent changes in weight. HEAD/EYES: Denies headache or blurry vision. ENT: Denies tinnitus, earache, nasal discharge, or sore throat. RESPIRATORY: Denies shortness of breath, cough, or wheezing. CARDIOVASCULAR: Denies chest pain, heart palpitations. Does have some lower extremity edema. GI: Has some rectal bleeding. Denies nausea, vomiting, diarrhea, constipation, abdominal pain, or melena. GU: Denies dysuria or hematuria. ENDOCRINE: Denies heat or cold intolerance. HEME/LYMPH: Denies easy bruising. Has blood in his stool. INTEGUMENTARY: Denies rash. MUSCULOSKELETAL: Has occasional joint pain associated with rheumatoid arthritis. Denies any arthralgias currently and has back pain. NEUROLOGIC: Has generalized weakness, some dizziness, lightheadedness especially with standing. Denies numbness, tingling, dysarthria, or headache. PSYCH: Denies depression or anxiety.   PHYSICAL EXAMINATION:   VITAL SIGNS: Temperature 98.1, pulse 86, blood pressure 111/66, respirations 20, oxygen sat 95% on 2 liters nasal cannula.   ORTHOSTATIC VITAL SIGNS: Blood pressure lying was 151/83, pulse 92; sitting blood pressure 140/76, pulse 90; standing blood pressure 76/68, pulse 102.   GENERAL: The patient is an obese male who is alert and oriented not acutely distressed but appears to be globally weak.   HEENT: Normocephalic, atraumatic. Pupils equal, round, and reactive to light and accommodation. Extraocular muscles are intact. Anicteric sclerae. Bilateral conjunctival pallor. Hearing intact to voice. Nares without drainage. Oral mucosa moist without lesions. The patient has poor dentition.   NECK: Supple with full range of motion. No JVD, lymphadenopathy, or carotid bruits bilaterally. No thyromegaly or tenderness to  palpation over thyroid gland.   CHEST: Normal respiratory effort without use of accessory respiratory muscles. Lungs are clear to auscultation bilaterally without crackles, rales, or wheezing.   CARDIOVASCULAR: S1, S2 positive. Regular rate and rhythm. 2 out of 6 systolic ejection murmur. PMI is non-lateralized.   ABDOMEN: Obese, soft, nontender, nondistended. Normoactive bowel sounds. No hepatosplenomegaly or palpable masses. No hernia. Evidence of scar to the left of his midline. Scar appears intact.   RECTAL: Rectal performed by ER physician per Dr. [name] patient had strongly Hemoccult positive stools with gross blood noted.   EXTREMITIES: 2+ pitting edema of bilateral lower extremities without clubbing, cyanosis, erythema, or warmth. Pedal pulses are palpable bilaterally.   SKIN: No suspicious rashes. Skin turgor is good.   LYMPH: No cervical lymphadenopathy.   NEUROLOGIC: Alert and oriented x3. Cranial nerves II through XII are grossly intact. No focal deficits. The patient is globally weak, however, strength is preserved 5 out of 5 in upper and lower extremities bilaterally. Sensation is intact. No facial droop. Speech is clear.   PSYCH: Pleasant male with appropriate affect.   LABORATORY, DIAGNOSTIC, AND RADIOLOGICAL DATA: CBC within normal limits except for hemoglobin 7.4, hematocrit 24. MCV normal at 85. CMP sodium 143, potassium 4.1, chloride 104, bicarb 33, BUN 21, creatinine 0.83, glucose 123, calcium 8.2, anion gap 6, total protein 6.4, albumin 2.7, total bilirubin 0.5, AST 20, ALT 25, alkaline phosphatase 98. INR 1. PTT 31.5.   ASSESSMENT AND PLAN: This is a 82 year old male with past medical history of rheumatoid arthritis, obstructive sleep apnea, chronic obstructive  pulmonary disease, benign prostatic hypertrophy, diverticulosis, unspecified anemia here with rectal bleeding and noted to be anemic and also noted to have profound volume loss with orthostatic hypotension  with: 1. Acute posthemorrhagic anemia from suspected GI bleed for which hospital admission is recommended for further evaluation and management. Will admit the patient to Med/Surg unit with off-unit tele. Suspect he may have a lower GI bleed with possible diverticular hemorrhage given painless rectal bleeding and history of diverticulosis. Will obtain a bleeding scan. Will hold his aspirin. Start volume resuscitation with IV fluids and also further volume expansion with PRBC transfusion as he has symptomatic anemia (I've explained the risks and benefits of blood transfusion, discussed alternatives, and answered all questions and the patient consents to transfusion and signed informed consent form will be obtained prior to transfusion). Will obtain serial hemoglobin and hematocrit checks. Keep patient on PPI therapy. Hold aspirin. Obtain GI consultation for further recommendations. Also obtain vascular surgical evaluation as the patient was noted to have positive bleeding scan. Will follow hemoglobin and hematocrit closely posttransfusion and watch for further bleeding. Further work-up and management to follow depending on the patient's clinical course. Please see below for further details.   2. Rectal bleeding. The patient is with painless hematochezia with concern for lower GI bleed and possible diverticular hemorrhage. As above, will obtain bleeding scan which has been ordered stat in the ER. If bleeding scan is positive, will obtain vascular surgical consultation. In the meanwhile, will keep patient on IV fluids and provide PRBC transfusion and obtain serial hemoglobin and hematocrit checks. Also, keep the patient on b.i.d. PPI therapy for now. Hold the patient's aspirin. Obtain GI consultation for further recommendations.  3. Orthostatic hypotension with profound volume loss from rectal bleeding. Need to review the patient's home medication but according to the patient and wife he is not on any blood pressure  medications at home. Will keep patient on IV fluids and also provide further volume expansion with PRBC transfusion and monitor orthostatic vital signs closely.  4. Cardiac murmur. This could be possibly functional from anemia. He also has significant lower extremity edema. Will further assess for CHF by getting EKG and 2-D echocardiogram and also obtain bilateral lower extremity venous Doppler ultrasound. The patient's blood pressure is too low at this time to tolerate diuretics.  5. Chronic obstructive pulmonary disease, stable and without acute exacerbation. Will write for Advair as he uses this p.r.n. at home. Also write for p.r.n. bronchodilator support with albuterol MDI and p.r.n. DuoNebs. 6. Obstructive sleep apnea. Continue nocturnal oxygen. 7. Benign prostatic hypertrophy. Need to verify the patient's home medications.  8. Rheumatoid arthritis, being followed by Dr. [facility]. Need to verify the patient's home medications, doses, and frequency. He takes prednisone at home and also another medication for arthritis that starts with an L, possibly with leflunomide per the wife. The wife will bring in the patient's medications and thereafter these will be reviewed. Would like to continue his prednisone for now and would not stop this agent abruptly as there may be associated risk of adrenal insufficiency. The patient may also require stress dose steroids.  9. DVT prophylaxis. SCDs and TEDs.   CODE STATUS: FULL CODE.        TIME SPENT ON THIS CRITICAL CARE ADMISSION: Approximately 65 minutes.  ____________________________ Elon AlasKamran N. Sophronia Varney, MD knl:drc D: 06/28/2011 16:06:59 ET T: 06/28/2011 16:58:14 ET JOB#: [id number]  cc: Elon AlasKamran N. Nuala Chiles, MD, <Dictator> Steele SizerMark A. Crissman, MD Elon AlasKAMRAN N Leva Baine MD ELECTRONICALLY SIGNED  07/07/2011 21:06 

## 2014-08-25 NOTE — Discharge Summary (Signed)
PATIENT NAME:  Nicholas Raymond, Nicholas Raymond MR#:  [id number] DATE OF BIRTH:  01-Feb-1933  DATE OF ADMISSION:  07/04/2011 DATE OF DISCHARGE:  07/08/2011  PRESENTING COMPLAINT: Shortness of breath.   DISCHARGE DIAGNOSES:  1. Acute on chronic mild diastolic congestive heart failure.  2. Acute respiratory failure secondary to diastolic congestive heart failure and chronic obstructive pulmonary disease flare with suspected possible nosocomial pneumonia.  3. Benign prostatic hypertrophy.  4. Rheumatoid arthritis. 5. Gastroesophageal reflux disease.  6. Chronic anemia.   CONDITION ON DISCHARGE: Fair. Vitals stable.   CODE STATUS: FULL CODE.   LABS ON DISCHARGE: Hemoglobin and hematocrit 9.2 and 29.8. White count 7.2, platelet count 396. Glucose 164, BUN 35, creatinine 1, sodium 138, potassium 4.2, chloride 96, bicarbonate is 39, calcium 8.3. Sputum cultures: Holding for possible pathogen, yeast and gram-positive cocci noted. Blood cultures negative in 36 hours. Urinalysis negative for urinary tract infection. PT-INR 14.3-1.1. Chest x-ray consistent with congestive heart failure with possible superimposed left lower lobe pneumonia.   MEDICATIONS ON DISCHARGE:  1. Levaquin 500 mg for four more days.  2. Prednisone 60 mg daily, taper x 10 mg then continue 5 mg on a daily basis.  3. Lasix 20 mg daily.  4. Vitamin B12 1000 mcg daily.  5. Albuterol inhaler 2 puffs q.i.d. p.r.n.  6. Norco 5/325, 1 tablet twice a day p.r.n.  7. Spiriva 1 capsule inhalation daily.  8. Ferrous sulfate 325 mg p.o. daily.  9. Advair 100/50, 1 puff twice a day.  10. Omeprazole 20 mg p.o. daily.  11. Tylenol 650 mg p.o. q. 4 hours p.r.n.  12. Avodart 0.5 mg p.o. daily.  13. Leflunomide 20 mg p.o. daily.   DIET: 2 grams sodium diet.   ACTIVITY: Physical therapy.   OXYGEN: 2 liters per minute continuous.  DuoNebs every four hours p.r.n.   CONSULTATIONS: Physical therapy.   BRIEF SUMMARY OF HOSPITAL COURSE: Mr. Nicholas Raymond is a  81 year old Caucasian gentleman whom I took care of only on 03/07. He was admitted on 07/04/2011 with increasing shortness of breath. He was admitted with:  1. Acute hypoxic respiratory failure: Certainly could be viral infection causing exacerbation since the patient had sick contacts at home. Likely due to acute on chronic congestive heart failure with chronic obstructive pulmonary disease flare. The patient was given IV Lasix. He diuresed about 10 liters since admission. He appears euvolemic and his Lasix was changed to p.o. Potassium and creatinine remained stable.  2. Chronic obstructive pulmonary disease flare: Likely suspected due to pneumonia, could be nosocomial since he was recently in the hospital. He remained afebrile. Blood cultures were negative. He received IV vancomycin and Zosyn. However, at discharge he was changed to p.o. Levaquin. His sputum grew  moderate gram-positive cocci and yeast. Nebulizer treatments and oral inhalers were continued. The patient is on home oxygen which has been continued two liters per minute continuous.  3. Acute on chronic diastolic dysfunction: The patient diuresed well, now changed to p.o. Lasix.  4. Benign prostatic hypertrophy: He was continued on Avodart.  5. Rheumatoid arthritis: On steroids and leflunomide.  6. Gastroesophageal reflux disease: On Prilosec.  7. Chronic anemia: Iron supplements were continued. His hemoglobin is stable around 9.2.  8. CODE STATUS: The patient remained a FULL CODE.        TIME SPENT: 40 minutes.  ____________________________ Wylie HailSona A. Allena KatzPatel, MD sap:bjt D: 07/08/2011 12:10:49 ET T: 07/08/2011 12:22:43 ET JOB#: [id number]  cc: Marlet Korte A. Allena KatzPatel, MD, <Dictator> Steele SizerMark A. Crissman, MD  Willow Ora MD ELECTRONICALLY SIGNED 07/15/2011 16:03

## 2015-01-02 DEATH — deceased
